# Patient Record
Sex: Female | Born: 1985
Health system: Southern US, Community
[De-identification: ages and names within clinical notes are randomized; demographics above are authoritative.]

## PROBLEM LIST (undated history)

## (undated) DIAGNOSIS — O09299 Supervision of pregnancy with other poor reproductive or obstetric history, unspecified trimester: Secondary | ICD-10-CM

## (undated) DIAGNOSIS — E039 Hypothyroidism, unspecified: Secondary | ICD-10-CM

## (undated) HISTORY — PX: OTHER SURGICAL HISTORY: SHX169

## (undated) HISTORY — PX: WISDOM TOOTH EXTRACTION: SHX21

## (undated) HISTORY — PX: ROTATOR CUFF REPAIR: SHX139

## (undated) HISTORY — DX: Supervision of pregnancy with other poor reproductive or obstetric history, unspecified trimester: O09.299

---

## 2015-08-06 DIAGNOSIS — H5213 Myopia, bilateral: Secondary | ICD-10-CM | POA: Diagnosis not present

## 2015-11-21 DIAGNOSIS — Z6820 Body mass index (BMI) 20.0-20.9, adult: Secondary | ICD-10-CM | POA: Diagnosis not present

## 2015-11-21 DIAGNOSIS — Z124 Encounter for screening for malignant neoplasm of cervix: Secondary | ICD-10-CM | POA: Diagnosis not present

## 2015-11-21 DIAGNOSIS — Z01419 Encounter for gynecological examination (general) (routine) without abnormal findings: Secondary | ICD-10-CM | POA: Diagnosis not present

## 2015-11-27 DIAGNOSIS — Z0000 Encounter for general adult medical examination without abnormal findings: Secondary | ICD-10-CM | POA: Diagnosis not present

## 2015-12-11 DIAGNOSIS — O200 Threatened abortion: Secondary | ICD-10-CM | POA: Diagnosis not present

## 2015-12-26 DIAGNOSIS — O200 Threatened abortion: Secondary | ICD-10-CM | POA: Diagnosis not present

## 2016-01-30 ENCOUNTER — Other Ambulatory Visit: Payer: Self-pay | Admitting: Obstetrics and Gynecology

## 2016-01-30 DIAGNOSIS — N63 Unspecified lump in unspecified breast: Secondary | ICD-10-CM

## 2016-02-05 ENCOUNTER — Ambulatory Visit
Admission: RE | Admit: 2016-02-05 | Discharge: 2016-02-05 | Disposition: A | Discharge status: Home / Self Care | Payer: 59 | Source: Ambulatory Visit | Attending: Obstetrics and Gynecology | Admitting: Obstetrics and Gynecology

## 2016-02-05 DIAGNOSIS — N6489 Other specified disorders of breast: Secondary | ICD-10-CM | POA: Diagnosis not present

## 2016-02-05 DIAGNOSIS — N63 Unspecified lump in unspecified breast: Secondary | ICD-10-CM

## 2016-02-07 DIAGNOSIS — N915 Oligomenorrhea, unspecified: Secondary | ICD-10-CM | POA: Diagnosis not present

## 2016-02-11 MED FILL — CLOMIPHENE CITRATE 50 MG TA: 50 | 5 days supply | Qty: 5 | Fill #0

## 2016-03-25 MED FILL — CLOMIPHENE CITRATE 50 MG TA: 50 | 28 days supply | Qty: 5 | Fill #0

## 2016-05-02 DIAGNOSIS — N970 Female infertility associated with anovulation: Secondary | ICD-10-CM | POA: Diagnosis not present

## 2016-05-17 DIAGNOSIS — N979 Female infertility, unspecified: Secondary | ICD-10-CM | POA: Diagnosis not present

## 2016-05-19 DIAGNOSIS — N970 Female infertility associated with anovulation: Secondary | ICD-10-CM | POA: Diagnosis not present

## 2016-05-19 DIAGNOSIS — N979 Female infertility, unspecified: Secondary | ICD-10-CM | POA: Diagnosis not present

## 2016-05-20 DIAGNOSIS — Z3141 Encounter for fertility testing: Secondary | ICD-10-CM | POA: Diagnosis not present

## 2016-06-02 DIAGNOSIS — N970 Female infertility associated with anovulation: Secondary | ICD-10-CM | POA: Diagnosis not present

## 2016-06-30 DIAGNOSIS — N970 Female infertility associated with anovulation: Secondary | ICD-10-CM | POA: Diagnosis not present

## 2016-07-13 DIAGNOSIS — N979 Female infertility, unspecified: Secondary | ICD-10-CM | POA: Diagnosis not present

## 2016-07-19 DIAGNOSIS — N979 Female infertility, unspecified: Secondary | ICD-10-CM | POA: Diagnosis not present

## 2016-07-21 DIAGNOSIS — N979 Female infertility, unspecified: Secondary | ICD-10-CM | POA: Diagnosis not present

## 2016-08-05 DIAGNOSIS — N979 Female infertility, unspecified: Secondary | ICD-10-CM | POA: Diagnosis not present

## 2016-08-12 DIAGNOSIS — N979 Female infertility, unspecified: Secondary | ICD-10-CM | POA: Diagnosis not present

## 2016-08-14 DIAGNOSIS — N979 Female infertility, unspecified: Secondary | ICD-10-CM | POA: Diagnosis not present

## 2016-08-20 DIAGNOSIS — H5213 Myopia, bilateral: Secondary | ICD-10-CM | POA: Diagnosis not present

## 2016-08-20 DIAGNOSIS — H52203 Unspecified astigmatism, bilateral: Secondary | ICD-10-CM | POA: Diagnosis not present

## 2016-09-17 DIAGNOSIS — N979 Female infertility, unspecified: Secondary | ICD-10-CM | POA: Diagnosis not present

## 2016-09-22 MED FILL — EMOQUETTE 28 DAY TABLET: 0.15-30 | 28 days supply | Qty: 28 | Fill #0

## 2016-10-06 DIAGNOSIS — N978 Female infertility of other origin: Secondary | ICD-10-CM | POA: Diagnosis not present

## 2016-10-06 DIAGNOSIS — N979 Female infertility, unspecified: Secondary | ICD-10-CM | POA: Diagnosis not present

## 2016-10-06 DIAGNOSIS — Z1159 Encounter for screening for other viral diseases: Secondary | ICD-10-CM | POA: Diagnosis not present

## 2016-10-06 DIAGNOSIS — Z1329 Encounter for screening for other suspected endocrine disorder: Secondary | ICD-10-CM | POA: Diagnosis not present

## 2016-10-13 DIAGNOSIS — Z31430 Encounter of female for testing for genetic disease carrier status for procreative management: Secondary | ICD-10-CM | POA: Diagnosis not present

## 2016-10-13 DIAGNOSIS — Z1371 Encounter for nonprocreative screening for genetic disease carrier status: Secondary | ICD-10-CM | POA: Diagnosis not present

## 2016-10-13 MED FILL — SYNTHROID 25 MCG TABLET: 25 | 60 days supply | Qty: 60 | Fill #0

## 2016-10-15 DIAGNOSIS — Z3183 Encounter for assisted reproductive fertility procedure cycle: Secondary | ICD-10-CM | POA: Diagnosis not present

## 2016-10-21 DIAGNOSIS — N979 Female infertility, unspecified: Secondary | ICD-10-CM | POA: Diagnosis not present

## 2016-10-24 DIAGNOSIS — Z3183 Encounter for assisted reproductive fertility procedure cycle: Secondary | ICD-10-CM | POA: Diagnosis not present

## 2016-10-27 DIAGNOSIS — Z3183 Encounter for assisted reproductive fertility procedure cycle: Secondary | ICD-10-CM | POA: Diagnosis not present

## 2016-10-29 DIAGNOSIS — Z3183 Encounter for assisted reproductive fertility procedure cycle: Secondary | ICD-10-CM | POA: Diagnosis not present

## 2016-10-31 DIAGNOSIS — N978 Female infertility of other origin: Secondary | ICD-10-CM | POA: Diagnosis not present

## 2016-11-14 DIAGNOSIS — Z3200 Encounter for pregnancy test, result unknown: Secondary | ICD-10-CM | POA: Diagnosis not present

## 2016-11-25 DIAGNOSIS — N978 Female infertility of other origin: Secondary | ICD-10-CM | POA: Diagnosis not present

## 2016-11-25 DIAGNOSIS — E039 Hypothyroidism, unspecified: Secondary | ICD-10-CM | POA: Diagnosis not present

## 2016-12-01 MED FILL — SYNTHROID 25 MCG TABLET: 25 | 30 days supply | Qty: 30 | Fill #0

## 2016-12-01 MED FILL — CRINONE 8% GEL: 8 | 30 days supply | Qty: 34 | Fill #0

## 2016-12-01 MED FILL — BD NEEDLES 27GX0.5: 27G X 1/2" | 10 days supply | Qty: 20 | Fill #0

## 2016-12-01 MED FILL — GONAL-F RFF REDI-JECT 450 U: 450 | 1 days supply | Qty: 1 | Fill #0

## 2016-12-01 MED FILL — BD SYRINGE 3 ML: 3 ML | 10 days supply | Qty: 20 | Fill #0

## 2016-12-01 MED FILL — EMOQUETTE 28 DAY TABLET: 0.15-30 | 28 days supply | Qty: 28 | Fill #0

## 2016-12-01 MED FILL — MENOPUR 75 UNIT VIAL: 75 | 10 days supply | Qty: 10 | Fill #0

## 2016-12-01 MED FILL — BD NEEDLES 27GX0.5": 27G X 1/2" | 10 days supply | Qty: 20 | Fill #0

## 2016-12-01 MED FILL — GONAL-F RFF REDI-JECT 900 U: 900 | 10 days supply | Qty: 8 | Fill #0

## 2016-12-01 MED FILL — PREGNYL 10,000 UNITS VIAL: 10000 | 1 days supply | Qty: 1 | Fill #0

## 2016-12-31 DIAGNOSIS — Z3183 Encounter for assisted reproductive fertility procedure cycle: Secondary | ICD-10-CM | POA: Diagnosis not present

## 2017-01-05 MED FILL — SYNTHROID 25 MCG TABLET: 25 | 30 days supply | Qty: 30 | Fill #1

## 2017-01-07 DIAGNOSIS — Z3183 Encounter for assisted reproductive fertility procedure cycle: Secondary | ICD-10-CM | POA: Diagnosis not present

## 2017-01-09 DIAGNOSIS — Z3183 Encounter for assisted reproductive fertility procedure cycle: Secondary | ICD-10-CM | POA: Diagnosis not present

## 2017-01-12 DIAGNOSIS — Z3183 Encounter for assisted reproductive fertility procedure cycle: Secondary | ICD-10-CM | POA: Diagnosis not present

## 2017-01-14 DIAGNOSIS — Z3183 Encounter for assisted reproductive fertility procedure cycle: Secondary | ICD-10-CM | POA: Diagnosis not present

## 2017-01-16 DIAGNOSIS — Z3183 Encounter for assisted reproductive fertility procedure cycle: Secondary | ICD-10-CM | POA: Diagnosis not present

## 2017-01-21 DIAGNOSIS — Z3183 Encounter for assisted reproductive fertility procedure cycle: Secondary | ICD-10-CM | POA: Diagnosis not present

## 2017-01-30 DIAGNOSIS — Z3200 Encounter for pregnancy test, result unknown: Secondary | ICD-10-CM | POA: Diagnosis not present

## 2017-02-01 DIAGNOSIS — Z3183 Encounter for assisted reproductive fertility procedure cycle: Secondary | ICD-10-CM | POA: Diagnosis not present

## 2017-02-02 MED FILL — SYNTHROID 25 MCG TABLET: 25 | 30 days supply | Qty: 30 | Fill #2

## 2017-02-10 DIAGNOSIS — Z3183 Encounter for assisted reproductive fertility procedure cycle: Secondary | ICD-10-CM | POA: Diagnosis not present

## 2017-02-18 DIAGNOSIS — Z3183 Encounter for assisted reproductive fertility procedure cycle: Secondary | ICD-10-CM | POA: Diagnosis not present

## 2017-02-24 DIAGNOSIS — Z3183 Encounter for assisted reproductive fertility procedure cycle: Secondary | ICD-10-CM | POA: Diagnosis not present

## 2017-03-02 MED FILL — ESTRADIOL 2 MG TABLET: 2 | 30 days supply | Qty: 90 | Fill #0

## 2017-03-02 MED FILL — SYNTHROID 25 MCG TABLET: 25 | 30 days supply | Qty: 30 | Fill #3

## 2017-03-03 DIAGNOSIS — Z3183 Encounter for assisted reproductive fertility procedure cycle: Secondary | ICD-10-CM | POA: Diagnosis not present

## 2017-03-12 DIAGNOSIS — Z3200 Encounter for pregnancy test, result unknown: Secondary | ICD-10-CM | POA: Diagnosis not present

## 2017-03-13 MED FILL — CRINONE 8% GEL: 8 | 30 days supply | Qty: 34 | Fill #0

## 2017-03-14 DIAGNOSIS — Z3201 Encounter for pregnancy test, result positive: Secondary | ICD-10-CM | POA: Diagnosis not present

## 2017-03-26 MED FILL — ESTRADIOL 2 MG TABLET: 2 | 30 days supply | Qty: 90 | Fill #1

## 2017-03-26 MED FILL — SYNTHROID 25 MCG TABLET: 25 | 30 days supply | Qty: 30 | Fill #4

## 2017-03-30 MED FILL — CRINONE 8% GEL: 8 | 30 days supply | Qty: 68 | Fill #0

## 2017-03-31 DIAGNOSIS — O09819 Supervision of pregnancy resulting from assisted reproductive technology, unspecified trimester: Secondary | ICD-10-CM | POA: Diagnosis not present

## 2017-04-13 DIAGNOSIS — O09819 Supervision of pregnancy resulting from assisted reproductive technology, unspecified trimester: Secondary | ICD-10-CM | POA: Diagnosis not present

## 2017-04-14 DIAGNOSIS — N925 Other specified irregular menstruation: Secondary | ICD-10-CM | POA: Diagnosis not present

## 2017-04-14 DIAGNOSIS — Z01419 Encounter for gynecological examination (general) (routine) without abnormal findings: Secondary | ICD-10-CM | POA: Diagnosis not present

## 2017-04-14 DIAGNOSIS — Z124 Encounter for screening for malignant neoplasm of cervix: Secondary | ICD-10-CM | POA: Diagnosis not present

## 2017-04-14 DIAGNOSIS — Z3480 Encounter for supervision of other normal pregnancy, unspecified trimester: Secondary | ICD-10-CM | POA: Diagnosis not present

## 2017-04-14 LAB — HM PAP SMEAR: HM PAP: NEGATIVE

## 2017-04-16 LAB — OB RESULTS CONSOLE GC/CHLAMYDIA: Chlamydia: NEGATIVE

## 2017-04-16 LAB — HM PAP SMEAR: HM Pap smear: NEGATIVE

## 2017-04-20 ENCOUNTER — Ambulatory Visit (INDEPENDENT_AMBULATORY_CARE_PROVIDER_SITE_OTHER): Payer: 59 | Admitting: Family Medicine

## 2017-04-20 ENCOUNTER — Encounter: Payer: Self-pay | Admitting: Family Medicine

## 2017-04-20 VITALS — BP 118/78 | Ht 66.5 in | Wt 135.8 lb

## 2017-04-20 DIAGNOSIS — Z0000 Encounter for general adult medical examination without abnormal findings: Secondary | ICD-10-CM

## 2017-04-20 DIAGNOSIS — Z3A09 9 weeks gestation of pregnancy: Secondary | ICD-10-CM | POA: Diagnosis not present

## 2017-04-20 LAB — POCT URINALYSIS DIP (PROADVANTAGE DEVICE)
BILIRUBIN UA: NEGATIVE
BILIRUBIN UA: NEGATIVE mg/dL
GLUCOSE UA: NEGATIVE mg/dL
Leukocytes, UA: NEGATIVE
NITRITE UA: NEGATIVE
PH UA: 6.5 (ref 5.0–8.0)
Protein Ur, POC: NEGATIVE mg/dL
RBC UA: NEGATIVE
SPECIFIC GRAVITY, URINE: 1.02
Urobilinogen, Ur: NEGATIVE

## 2017-04-20 NOTE — Patient Instructions (Signed)

## 2017-04-20 NOTE — Progress Notes (Signed)
Subjective:    Patient ID: Robertine Kipper, female    DOB: [birth year], 31 y.o.   MRN: [id number]  HPI Chief Complaint  Patient presents with  . new pt    new pt fasting cpe, wants to check about food allergies   She is new to the practice and here for a complete physical exam. Previous medical care: PCP in [place].  Dr. Philis Pique is her OB/GYN who she has been seeing regularly due to pregnancy.   Dr. Toney Reil - fertility specialist and endocrinologist. She has been attempting pregnancy and has history of 1 early miscarriage last year. States she is now 9 1/[redacted] weeks pregnant.   States she has noticed some possible food intolerances over the past several weeks that started shortly before her pregnancy. Certain foods such as peanut butter cause her to have upset stomach and belching and this is fairly new.   BP at her OB/GYN last week was 120/70.   Social history: Lives with husband, works as PT Diet: healthy  Excerise: 3 days per week.   Immunizations: up to date, [employer] employee   Health maintenance:  Mammogram: N/A Colonoscopy: N/A Last Gynecological Exam: pap smear last week at OB/GYN  Last Menstrual cycle: irregular due to fertility treatments.   Last Dental Exam: twice annually  Last Eye Exam: every March   Depression screen [facility] 2/9 04/20/2017  Decreased Interest 0  Down, Depressed, Hopeless 0  PHQ - 2 Score 0    Wears seatbelt always, uses sunscreen, smoke detectors in home and functioning, does not text while driving and feels safe in home environment.   Reviewed allergies, medications, past medical, surgical, family, and social history.   Review of Systems Review of Systems Constitutional: -fever, -chills, -sweats, -unexpected weight change,-fatigue ENT: -runny nose, -ear pain, -sore throat Cardiology:  -chest pain, -palpitations, -edema Respiratory: -cough, -shortness of breath, -wheezing Gastroenterology: -abdominal pain, -nausea, -vomiting, -diarrhea,  -constipation  Hematology: -bleeding or bruising problems Musculoskeletal: -arthralgias, -myalgias, -joint swelling, -back pain Ophthalmology: -vision changes Urology: -dysuria, -difficulty urinating, -hematuria, -urinary frequency, -urgency Neurology: -headache, -weakness, -tingling, -numbness       Objective:   Physical Exam BP 118/78   Ht 5' 6.5" (1.689 m)   Wt 135 lb 12.8 oz (61.6 kg)   BMI 21.59 kg/m   General Appearance:    Alert, cooperative, no distress, appears stated age  Head:    Normocephalic, without obvious abnormality, atraumatic  Eyes:    PERRL, conjunctiva/corneas clear, EOM's intact, fundi    benign  Ears:    Normal TM's and external ear canals  Nose:   Nares normal, mucosa normal, no drainage or sinus   tenderness  Throat:   Lips, mucosa, and tongue normal; teeth and gums normal  Neck:   Supple, no lymphadenopathy;  thyroid:  no   enlargement/tenderness/nodules; no carotid   bruit or JVD  Back:    Spine nontender, no curvature, ROM normal, no CVA     tenderness  Lungs:     Clear to auscultation bilaterally without wheezes, rales or     ronchi; respirations unlabored  Chest Wall:    No tenderness or deformity   Heart:    Regular rate and rhythm, S1 and S2 normal, no murmur, rub   or gallop  Breast Exam:    Done at OB/GYN  Abdomen:     Soft, non-tender, nondistended, normoactive bowel sounds,    no masses, no hepatosplenomegaly  Genitalia:    Done at OB/GYN  Rectal:  Not performed due to age<40 and no related complaints  Extremities:   No clubbing, cyanosis or edema  Pulses:   2+ and symmetric all extremities  Skin:   Skin color, texture, turgor normal, no rashes or lesions  Lymph nodes:   Cervical, supraclavicular, and axillary nodes normal  Neurologic:   CNII-XII intact, normal strength, sensation and gait; reflexes 2+ and symmetric throughout          Psych:   Normal mood, affect, hygiene and grooming.    Urinalysis dipstick: negative        Assessment & Plan:  Routine general medical examination at a health care facility - Plan: POCT Urinalysis DIP (Proadvantage Device), CBC with Differential/Platelet, Comprehensive metabolic panel, Lipid panel  [redacted] weeks gestation of pregnancy  She is pleasant and healthy appearing.   Up to date with health maintenance and immunizations.  Will check labs and follow up.  She is getting regular prenatal care and I wish her the best with her current pregnancy.  I will see her back as needed.

## 2017-04-21 ENCOUNTER — Encounter: Payer: Self-pay | Admitting: Family Medicine

## 2017-04-21 LAB — LIPID PANEL
CHOL/HDL RATIO: 2.4 (calc) (ref <5.0)
CHOLESTEROL: 190 mg/dL (ref <200)
HDL: 78 mg/dL (ref >50)
LDL CHOLESTEROL (CALC): 92 mg/dL
Non-HDL Cholesterol (Calc): 112 mg/dL (calc) (ref <130)
TRIGLYCERIDES: 103 mg/dL (ref <150)

## 2017-04-21 LAB — COMPREHENSIVE METABOLIC PANEL
AG Ratio: 1.2 (calc) (ref 1.0–2.5)
ALBUMIN MSPROF: 4 g/dL (ref 3.6–5.1)
ALT: 8 U/L (ref 6–29)
AST: 18 U/L (ref 10–30)
Alkaline phosphatase (APISO): 44 U/L (ref 33–115)
BUN: 8 mg/dL (ref 7–25)
CO2: 21 mmol/L (ref 20–32)
Calcium: 9.1 mg/dL (ref 8.6–10.2)
Chloride: 103 mmol/L (ref 98–110)
Creat: 0.66 mg/dL (ref 0.50–1.10)
GLOBULIN: 3.3 g/dL (ref 1.9–3.7)
GLUCOSE: 88 mg/dL (ref 65–99)
POTASSIUM: 4.5 mmol/L (ref 3.5–5.3)
Sodium: 135 mmol/L (ref 135–146)
TOTAL PROTEIN: 7.3 g/dL (ref 6.1–8.1)
Total Bilirubin: 1.3 mg/dL — ABNORMAL HIGH (ref 0.2–1.2)

## 2017-04-21 LAB — CBC WITH DIFFERENTIAL/PLATELET
BASOS PCT: 0.7 %
Basophils Absolute: 50 cells/uL (ref 0–200)
EOS ABS: 22 {cells}/uL (ref 15–500)
Eosinophils Relative: 0.3 %
HCT: 40.1 % (ref 35.0–45.0)
Hemoglobin: 13.6 g/dL (ref 11.7–15.5)
Lymphs Abs: 1202 cells/uL (ref 850–3900)
MCH: 30.5 pg (ref 27.0–33.0)
MCHC: 33.9 g/dL (ref 32.0–36.0)
MCV: 89.9 fL (ref 80.0–100.0)
MONOS PCT: 9.2 %
MPV: 10.6 fL (ref 7.5–12.5)
NEUTROS PCT: 73.1 %
Neutro Abs: 5263 cells/uL (ref 1500–7800)
PLATELETS: 298 10*3/uL (ref 140–400)
RBC: 4.46 10*6/uL (ref 3.80–5.10)
RDW: 11.7 % (ref 11.0–15.0)
TOTAL LYMPHOCYTE: 16.7 %
WBC: 7.2 10*3/uL (ref 3.8–10.8)
WBCMIX: 662 {cells}/uL (ref 200–950)

## 2017-04-24 ENCOUNTER — Encounter: Payer: Self-pay | Admitting: Family Medicine

## 2017-05-06 DIAGNOSIS — Z3682 Encounter for antenatal screening for nuchal translucency: Secondary | ICD-10-CM | POA: Diagnosis not present

## 2017-05-06 DIAGNOSIS — Z3480 Encounter for supervision of other normal pregnancy, unspecified trimester: Secondary | ICD-10-CM | POA: Diagnosis not present

## 2017-05-06 DIAGNOSIS — E039 Hypothyroidism, unspecified: Secondary | ICD-10-CM | POA: Diagnosis not present

## 2017-05-06 MED FILL — SYNTHROID 25 MCG TABLET: 25 | 90 days supply | Qty: 90 | Fill #0

## 2017-06-03 DIAGNOSIS — Z3482 Encounter for supervision of other normal pregnancy, second trimester: Secondary | ICD-10-CM | POA: Diagnosis not present

## 2017-06-23 NOTE — L&D Delivery Note (Signed)
Delivery Note At 3:33 AM a viable and healthy female was delivered via Vaginal, Spontaneous (Presentation: Left Occiput Anterior;  ).  APGAR: 6, 8; weight 3 lb 13 oz (1730 g).   Placenta status: Spontaneous, intact.  Cord: 3V   Anesthesia: Epidural   Episiotomy: None Lacerations: 1st degree Suture Repair: 3.0 vicryl Est. Blood Loss (mL): 150  Mom to postpartum.  Baby to NICU.  Vanessa Kick 10/06/2017, 4:06 AM

## 2017-06-26 DIAGNOSIS — Z363 Encounter for antenatal screening for malformations: Secondary | ICD-10-CM | POA: Diagnosis not present

## 2017-06-26 DIAGNOSIS — E039 Hypothyroidism, unspecified: Secondary | ICD-10-CM | POA: Diagnosis not present

## 2017-07-24 DIAGNOSIS — Z369 Encounter for antenatal screening, unspecified: Secondary | ICD-10-CM | POA: Diagnosis not present

## 2017-08-03 MED FILL — SYNTHROID 25 MCG TABLET: 25 | 90 days supply | Qty: 90 | Fill #1

## 2017-08-20 DIAGNOSIS — Z23 Encounter for immunization: Secondary | ICD-10-CM | POA: Diagnosis not present

## 2017-08-20 DIAGNOSIS — Z3480 Encounter for supervision of other normal pregnancy, unspecified trimester: Secondary | ICD-10-CM | POA: Diagnosis not present

## 2017-08-20 DIAGNOSIS — Z369 Encounter for antenatal screening, unspecified: Secondary | ICD-10-CM | POA: Diagnosis not present

## 2017-08-26 DIAGNOSIS — H52203 Unspecified astigmatism, bilateral: Secondary | ICD-10-CM | POA: Diagnosis not present

## 2017-08-26 DIAGNOSIS — H5213 Myopia, bilateral: Secondary | ICD-10-CM | POA: Diagnosis not present

## 2017-09-03 DIAGNOSIS — O99810 Abnormal glucose complicating pregnancy: Secondary | ICD-10-CM | POA: Diagnosis not present

## 2017-09-09 DIAGNOSIS — E039 Hypothyroidism, unspecified: Secondary | ICD-10-CM | POA: Diagnosis not present

## 2017-09-09 DIAGNOSIS — O4423 Partial placenta previa NOS or without hemorrhage, third trimester: Secondary | ICD-10-CM | POA: Diagnosis not present

## 2017-09-23 DIAGNOSIS — O139 Gestational [pregnancy-induced] hypertension without significant proteinuria, unspecified trimester: Secondary | ICD-10-CM | POA: Diagnosis not present

## 2017-09-28 DIAGNOSIS — O133 Gestational [pregnancy-induced] hypertension without significant proteinuria, third trimester: Secondary | ICD-10-CM | POA: Diagnosis not present

## 2017-09-28 DIAGNOSIS — O26843 Uterine size-date discrepancy, third trimester: Secondary | ICD-10-CM | POA: Diagnosis not present

## 2017-10-01 ENCOUNTER — Inpatient Hospital Stay (HOSPITAL_COMMUNITY)
Admission: AD | Admit: 2017-10-01 | Discharge: 2017-10-01 | Disposition: A | Discharge status: Home / Self Care | Payer: 59 | Source: Ambulatory Visit | Attending: Obstetrics and Gynecology | Admitting: Obstetrics and Gynecology

## 2017-10-01 DIAGNOSIS — Z3A33 33 weeks gestation of pregnancy: Secondary | ICD-10-CM | POA: Insufficient documentation

## 2017-10-01 DIAGNOSIS — O139 Gestational [pregnancy-induced] hypertension without significant proteinuria, unspecified trimester: Secondary | ICD-10-CM | POA: Diagnosis not present

## 2017-10-01 DIAGNOSIS — O133 Gestational [pregnancy-induced] hypertension without significant proteinuria, third trimester: Secondary | ICD-10-CM | POA: Diagnosis not present

## 2017-10-01 DIAGNOSIS — E039 Hypothyroidism, unspecified: Secondary | ICD-10-CM | POA: Diagnosis not present

## 2017-10-01 MED ORDER — BETAMETHASONE SOD PHOS & ACET 6 (3-3) MG/ML IJ SUSP
12.0000 mg | Freq: Once | INTRAMUSCULAR | Status: AC
  Administered 2017-10-01: 12 mg via INTRAMUSCULAR
  Filled 2017-10-01: qty 2

## 2017-10-01 NOTE — MAU Note (Signed)
Pt here for Betamethasone inject #1

## 2017-10-02 ENCOUNTER — Encounter (HOSPITAL_COMMUNITY): Payer: Self-pay | Admitting: *Deleted

## 2017-10-02 ENCOUNTER — Inpatient Hospital Stay (EMERGENCY_DEPARTMENT_HOSPITAL)
Admission: AD | Admit: 2017-10-02 | Discharge: 2017-10-02 | Disposition: A | Discharge status: Home / Self Care | Payer: 59 | Source: Ambulatory Visit | Attending: Obstetrics and Gynecology | Admitting: Obstetrics and Gynecology

## 2017-10-02 DIAGNOSIS — O1414 Severe pre-eclampsia complicating childbirth: Secondary | ICD-10-CM | POA: Diagnosis not present

## 2017-10-02 DIAGNOSIS — D649 Anemia, unspecified: Secondary | ICD-10-CM | POA: Diagnosis not present

## 2017-10-02 DIAGNOSIS — O479 False labor, unspecified: Secondary | ICD-10-CM | POA: Diagnosis not present

## 2017-10-02 DIAGNOSIS — O4700 False labor before 37 completed weeks of gestation, unspecified trimester: Secondary | ICD-10-CM

## 2017-10-02 DIAGNOSIS — R030 Elevated blood-pressure reading, without diagnosis of hypertension: Secondary | ICD-10-CM | POA: Diagnosis not present

## 2017-10-02 DIAGNOSIS — O1493 Unspecified pre-eclampsia, third trimester: Secondary | ICD-10-CM | POA: Diagnosis not present

## 2017-10-02 DIAGNOSIS — O9902 Anemia complicating childbirth: Secondary | ICD-10-CM | POA: Diagnosis not present

## 2017-10-02 DIAGNOSIS — Z3A33 33 weeks gestation of pregnancy: Secondary | ICD-10-CM | POA: Diagnosis not present

## 2017-10-02 HISTORY — DX: Hypothyroidism, unspecified: E03.9

## 2017-10-02 LAB — COMPREHENSIVE METABOLIC PANEL
ALT: 24 U/L (ref 14–54)
ANION GAP: 9 (ref 5–15)
AST: 35 U/L (ref 15–41)
Albumin: 3.2 g/dL — ABNORMAL LOW (ref 3.5–5.0)
Alkaline Phosphatase: 121 U/L (ref 38–126)
BUN: 13 mg/dL (ref 6–20)
CHLORIDE: 107 mmol/L (ref 101–111)
CO2: 20 mmol/L — ABNORMAL LOW (ref 22–32)
Calcium: 9 mg/dL (ref 8.9–10.3)
Creatinine, Ser: 0.7 mg/dL (ref 0.44–1.00)
Glucose, Bld: 107 mg/dL — ABNORMAL HIGH (ref 65–99)
Potassium: 4 mmol/L (ref 3.5–5.1)
Sodium: 136 mmol/L (ref 135–145)
Total Bilirubin: 0.9 mg/dL (ref 0.3–1.2)
Total Protein: 7.5 g/dL (ref 6.5–8.1)

## 2017-10-02 LAB — CBC
HCT: 35.1 % — ABNORMAL LOW (ref 36.0–46.0)
Hemoglobin: 12.6 g/dL (ref 12.0–15.0)
MCH: 31.8 pg (ref 26.0–34.0)
MCHC: 35.9 g/dL (ref 30.0–36.0)
MCV: 88.6 fL (ref 78.0–100.0)
PLATELETS: 276 10*3/uL (ref 150–400)
RBC: 3.96 MIL/uL (ref 3.87–5.11)
RDW: 12.4 % (ref 11.5–15.5)
WBC: 19.7 10*3/uL — ABNORMAL HIGH (ref 4.0–10.5)

## 2017-10-02 LAB — PROTEIN / CREATININE RATIO, URINE
CREATININE, URINE: 109 mg/dL
Protein Creatinine Ratio: 0.15 mg/mg{Cre} (ref 0.00–0.15)
TOTAL PROTEIN, URINE: 16 mg/dL

## 2017-10-02 MED ORDER — LABETALOL HCL 200 MG PO TABS: 200.0000 mg | ORAL_TABLET | Freq: Two times a day (BID) | ORAL | 0 refills | Status: DC

## 2017-10-02 MED ORDER — LACTATED RINGERS IV BOLUS
1000.0000 mL | Freq: Once | INTRAVENOUS | Status: AC
  Administered 2017-10-02: 1000 mL via INTRAVENOUS

## 2017-10-02 MED ORDER — LABETALOL HCL 100 MG PO TABS
200.0000 mg | ORAL_TABLET | Freq: Once | ORAL | Status: AC
  Administered 2017-10-02: 200 mg via ORAL
  Filled 2017-10-02: qty 2

## 2017-10-02 MED ORDER — BETAMETHASONE SOD PHOS & ACET 6 (3-3) MG/ML IJ SUSP
12.0000 mg | Freq: Once | INTRAMUSCULAR | Status: AC
  Administered 2017-10-02: 12 mg via INTRAMUSCULAR
  Filled 2017-10-02: qty 2

## 2017-10-02 MED ORDER — NIFEDIPINE 10 MG PO CAPS
10.0000 mg | ORAL_CAPSULE | ORAL | Status: AC | PRN
  Administered 2017-10-02 (×3): 10 mg via ORAL
  Filled 2017-10-02 (×3): qty 1

## 2017-10-02 NOTE — Progress Notes (Signed)
MD informed of pt's BP, orders received for labs, serial BP's, & FHR monitoring.

## 2017-10-02 NOTE — MAU Provider Note (Addendum)
Patient Kimberly Gomez is a 32 y.o. G1P0 At [redacted]w[redacted]d here for 2nd shot of BMZ and for evaluation of elevated BP.  She denies blurry vision, RUQ pain, NV, HA or sudden swelling.  History     CSN: [id number]  Arrival date and time: 10/02/17 1643   None     Chief Complaint  Patient presents with  . Injections   HPI Patient was diagnosed with gestational hypertension by Dr. Malachi Carl office on Thursday, April 4 with bp of 140/90s at her regular prenatal visit.  She had a follow up on Monday and she had BPs in 150/90s. HEr blood work was fine; urine was fine.   She had an appt yesterday for prenatal and NST twice a week. Yesterday she had trace protein and had blood work (patient doesn't know the results). Her BPs yesterday were 160/90 and 152/90.  She received BMZ yesterday and then was told to come to MAU for her 2nd shot today (Friday, April 12).   When she had her vitals taken today in MAU triage, it was elevated. Dr. Ouida Sills recommended that she stay for monitoring and blood work and PCR.    She has been feeling abdominal pain off and on for the past few days; feels like small period cramps. She rates them 3/10, she is not actively worried about them.   She has been having loose stools for two weeks; she denies sick contacts. No one around her has been sick.  OB History    Gravida  1   Para      Term      Preterm      AB      Living        SAB      TAB      Ectopic      Multiple      Live Births              Past Medical History:  Diagnosis Date  . Hypothyroidism     Past Surgical History:  Procedure Laterality Date  . ROTATOR CUFF REPAIR Left   . WISDOM TOOTH EXTRACTION      Family History  Problem Relation Age of Onset  . Hyperlipidemia Father   . Hyperlipidemia Maternal Grandmother   . Heart failure Paternal Grandmother     Social History   Tobacco Use  . Smoking status: Never Smoker  . Smokeless tobacco: Never Used  Substance Use  Topics  . Alcohol use: No  . Drug use: No    Allergies:  Allergies  Allergen Reactions  . Amoxicillin   . Penicillins     No medications prior to admission.    Review of Systems  Constitutional: Negative.   HENT: Negative.   Respiratory: Negative.   Cardiovascular: Negative.   Gastrointestinal: Negative.   Genitourinary: Negative.   Musculoskeletal: Negative.   Neurological: Negative.   Hematological: Negative.   Psychiatric/Behavioral: Negative.    Physical Exam   Blood pressure (!) 141/95, pulse 88, temperature 98.6 F (37 C), temperature source Oral, resp. rate 16.  Physical Exam  Constitutional: She is oriented to person, place, and time.  HENT:  Head: Normocephalic.  Neck: Normal range of motion.  GI: Soft.  Neurological: She is alert and oriented to person, place, and time.  Skin: Skin is warm and dry.  Psychiatric: She has a normal mood and affect.    MAU Course  Procedures  MDM -Patient's labs are stable; no signs of pre-e.  -  Patient continues to be asympmtomatic.  -NST: 140 bpm, mod var, present acel, neg decels, contractions every 4-5 min. Contractions are 5 min apart, she rates them very mild.   -Cervix checked at 1915 and at 2045; still long closed and thick.   -10 mg of procardia given x 3 in MAU (see med rec for times).   Patient's contractions seem to be stronger and more frequent to her now at 2100 Per Dr. Ouida Sills, will try 1 Liter of LR before moving to terbutaline. Pressures slightly lower now; still asymptomatic.    Patient care handed off to [place] at 2146.  Assessment and Plan   After 1L of LR, pt still having ctx q 3-5 minutes. She is feeling about 1/2 of them as "tightening" and the other 1/2 as cramps.  Cx at 2230 LTC, unchanged for 3 hours.  Pt does not want terbutaline.  Dr. Ouida Sills called and given report.  ORdered Labetalol 200mg  BID and for pt to f/u on Monday in office.  Pt in agreement w/plan. Husband can monitor BP at home.   Pt aware to f/U ASAP if bp>160/110.   [name] 10/02/2017, 11:44 PM

## 2017-10-02 NOTE — MAU Note (Signed)
Pt here for 2nd BMZ, denies uc's, bleeding or LOF.

## 2017-10-02 NOTE — Discharge Instructions (Signed)
Braxton Hicks Contractions °Contractions of the uterus can occur throughout pregnancy, but they are not always a sign that you are in labor. You may have practice contractions called Braxton Hicks contractions. These false labor contractions are sometimes confused with true labor. °What are Braxton Hicks contractions? °Braxton Hicks contractions are tightening movements that occur in the muscles of the uterus before labor. Unlike true labor contractions, these contractions do not result in opening (dilation) and thinning of the cervix. Toward the end of pregnancy (32-34 weeks), Braxton Hicks contractions can happen more often and may become stronger. These contractions are sometimes difficult to tell apart from true labor because they can be very uncomfortable. You should not feel embarrassed if you go to the hospital with false labor. °Sometimes, the only way to tell if you are in true labor is for your health care provider to look for changes in the cervix. The health care provider will do a physical exam and may monitor your contractions. If you are not in true labor, the exam should show that your cervix is not dilating and your water has not broken. °If there are other health problems associated with your pregnancy, it is completely safe for you to be sent home with false labor. You may continue to have Braxton Hicks contractions until you go into true labor. °How to tell the difference between true labor and false labor °True labor °· Contractions last 30-70 seconds. °· Contractions become very regular. °· Discomfort is usually felt in the top of the uterus, and it spreads to the lower abdomen and low back. °· Contractions do not go away with walking. °· Contractions usually become more intense and increase in frequency. °· The cervix dilates and gets thinner. °False labor °· Contractions are usually shorter and not as strong as true labor contractions. °· Contractions are usually irregular. °· Contractions  are often felt in the front of the lower abdomen and in the groin. °· Contractions may go away when you walk around or change positions while lying down. °· Contractions get weaker and are shorter-lasting as time goes on. °· The cervix usually does not dilate or become thin. °Follow these instructions at home: °· Take over-the-counter and prescription medicines only as told by your health care provider. °· Keep up with your usual exercises and follow other instructions from your health care provider. °· Eat and drink lightly if you think you are going into labor. °· If Braxton Hicks contractions are making you uncomfortable: °? Change your position from lying down or resting to walking, or change from walking to resting. °? Sit and rest in a tub of warm water. °? Drink enough fluid to keep your urine pale yellow. Dehydration may cause these contractions. °? Do slow and deep breathing several times an hour. °· Keep all follow-up prenatal visits as told by your health care provider. This is important. °Contact a health care provider if: °· You have a fever. °· You have continuous pain in your abdomen. °Get help right away if: °· Your contractions become stronger, more regular, and closer together. °· You have fluid leaking or gushing from your vagina. °· You pass blood-tinged mucus (bloody show). °· You have bleeding from your vagina. °· You have low back pain that you never had before. °· You feel your baby’s head pushing down and causing pelvic pressure. °· Your baby is not moving inside you as much as it used to. °Summary °· Contractions that occur before labor are called Braxton   Hicks contractions, false labor, or practice contractions. °· Braxton Hicks contractions are usually shorter, weaker, farther apart, and less regular than true labor contractions. True labor contractions usually become progressively stronger and regular and they become more frequent. °· Manage discomfort from Braxton Hicks contractions by  changing position, resting in a warm bath, drinking plenty of water, or practicing deep breathing. °This information is not intended to replace advice given to you by your health care provider. Make sure you discuss any questions you have with your health care provider. °Document Released: 10/23/2016 Document Revised: 10/23/2016 Document Reviewed: 10/23/2016 °Elsevier Interactive Patient Education © 2018 [employer] ° °

## 2017-10-05 ENCOUNTER — Encounter (HOSPITAL_COMMUNITY): Payer: Self-pay

## 2017-10-05 ENCOUNTER — Other Ambulatory Visit: Payer: Self-pay

## 2017-10-05 ENCOUNTER — Inpatient Hospital Stay (HOSPITAL_COMMUNITY): Payer: 59

## 2017-10-05 ENCOUNTER — Inpatient Hospital Stay (HOSPITAL_COMMUNITY)
Admission: AD | Admit: 2017-10-05 | Discharge: 2017-10-10 | DRG: 807 | Disposition: A | Discharge status: Home / Self Care | Payer: 59 | Source: Ambulatory Visit | Attending: Obstetrics and Gynecology | Admitting: Obstetrics and Gynecology

## 2017-10-05 DIAGNOSIS — O1494 Unspecified pre-eclampsia, complicating childbirth: Secondary | ICD-10-CM | POA: Diagnosis not present

## 2017-10-05 DIAGNOSIS — O164 Unspecified maternal hypertension, complicating childbirth: Secondary | ICD-10-CM | POA: Diagnosis not present

## 2017-10-05 DIAGNOSIS — O163 Unspecified maternal hypertension, third trimester: Secondary | ICD-10-CM | POA: Diagnosis not present

## 2017-10-05 DIAGNOSIS — Z3A00 Weeks of gestation of pregnancy not specified: Secondary | ICD-10-CM | POA: Diagnosis not present

## 2017-10-05 DIAGNOSIS — O1414 Severe pre-eclampsia complicating childbirth: Secondary | ICD-10-CM | POA: Diagnosis present

## 2017-10-05 DIAGNOSIS — O1493 Unspecified pre-eclampsia, third trimester: Secondary | ICD-10-CM | POA: Diagnosis not present

## 2017-10-05 DIAGNOSIS — O9902 Anemia complicating childbirth: Secondary | ICD-10-CM | POA: Diagnosis present

## 2017-10-05 DIAGNOSIS — Z3A33 33 weeks gestation of pregnancy: Secondary | ICD-10-CM | POA: Diagnosis not present

## 2017-10-05 DIAGNOSIS — R030 Elevated blood-pressure reading, without diagnosis of hypertension: Secondary | ICD-10-CM | POA: Diagnosis present

## 2017-10-05 DIAGNOSIS — O133 Gestational [pregnancy-induced] hypertension without significant proteinuria, third trimester: Secondary | ICD-10-CM

## 2017-10-05 DIAGNOSIS — O43819 Placental infarction, unspecified trimester: Secondary | ICD-10-CM | POA: Diagnosis not present

## 2017-10-05 DIAGNOSIS — D649 Anemia, unspecified: Secondary | ICD-10-CM | POA: Diagnosis present

## 2017-10-05 DIAGNOSIS — Z3689 Encounter for other specified antenatal screening: Secondary | ICD-10-CM

## 2017-10-05 DIAGNOSIS — I10 Essential (primary) hypertension: Secondary | ICD-10-CM

## 2017-10-05 DIAGNOSIS — O1403 Mild to moderate pre-eclampsia, third trimester: Secondary | ICD-10-CM | POA: Diagnosis not present

## 2017-10-05 HISTORY — DX: Gestational (pregnancy-induced) hypertension without significant proteinuria, third trimester: O13.3

## 2017-10-05 LAB — URINALYSIS, ROUTINE W REFLEX MICROSCOPIC
Bilirubin Urine: NEGATIVE
GLUCOSE, UA: NEGATIVE mg/dL
Hgb urine dipstick: NEGATIVE
KETONES UR: NEGATIVE mg/dL
Leukocytes, UA: NEGATIVE
Nitrite: NEGATIVE
Protein, ur: 100 mg/dL — AB
RBC / HPF: NONE SEEN RBC/hpf (ref 0–5)
Specific Gravity, Urine: 1.005 (ref 1.005–1.030)
pH: 8 (ref 5.0–8.0)

## 2017-10-05 LAB — CBC
HEMATOCRIT: 33.9 % — AB (ref 36.0–46.0)
Hemoglobin: 12.2 g/dL (ref 12.0–15.0)
MCH: 32 pg (ref 26.0–34.0)
MCHC: 36 g/dL (ref 30.0–36.0)
MCV: 89 fL (ref 78.0–100.0)
PLATELETS: 241 10*3/uL (ref 150–400)
RBC: 3.81 MIL/uL — AB (ref 3.87–5.11)
RDW: 12.6 % (ref 11.5–15.5)
WBC: 15.2 10*3/uL — ABNORMAL HIGH (ref 4.0–10.5)

## 2017-10-05 LAB — COMPREHENSIVE METABOLIC PANEL
ALBUMIN: 3 g/dL — AB (ref 3.5–5.0)
ALT: 27 U/L (ref 14–54)
AST: 38 U/L (ref 15–41)
Alkaline Phosphatase: 114 U/L (ref 38–126)
Anion gap: 9 (ref 5–15)
BILIRUBIN TOTAL: 1 mg/dL (ref 0.3–1.2)
BUN: 13 mg/dL (ref 6–20)
CO2: 19 mmol/L — ABNORMAL LOW (ref 22–32)
CREATININE: 0.73 mg/dL (ref 0.44–1.00)
Calcium: 8.5 mg/dL — ABNORMAL LOW (ref 8.9–10.3)
Chloride: 107 mmol/L (ref 101–111)
GFR calc Af Amer: >60 mL/min (ref >60)
GLUCOSE: 88 mg/dL (ref 65–99)
Potassium: 4 mmol/L (ref 3.5–5.1)
Sodium: 135 mmol/L (ref 135–145)
Total Protein: 6.7 g/dL (ref 6.5–8.1)

## 2017-10-05 LAB — GROUP B STREP BY PCR: Group B strep by PCR: NEGATIVE

## 2017-10-05 LAB — PROTEIN / CREATININE RATIO, URINE
Creatinine, Urine: 48 mg/dL
Protein Creatinine Ratio: 2.29 mg/mg{Cre} — ABNORMAL HIGH (ref 0.00–0.15)
Total Protein, Urine: 110 mg/dL

## 2017-10-05 LAB — OB RESULTS CONSOLE GBS: GBS: NEGATIVE

## 2017-10-05 MED ORDER — MAGNESIUM SULFATE BOLUS VIA INFUSION
6.0000 g | Freq: Once | INTRAVENOUS | Status: AC
  Administered 2017-10-05: 6 g via INTRAVENOUS
  Filled 2017-10-05: qty 500

## 2017-10-05 MED ORDER — LACTATED RINGERS IV SOLN
INTRAVENOUS | Status: DC
  Administered 2017-10-05: 08:00:00 via INTRAVENOUS

## 2017-10-05 MED ORDER — LABETALOL HCL 200 MG PO TABS
200.0000 mg | ORAL_TABLET | Freq: Two times a day (BID) | ORAL | Status: DC
  Administered 2017-10-05 – 2017-10-08 (×7): 200 mg via ORAL
  Filled 2017-10-05 (×7): qty 1

## 2017-10-05 MED ORDER — HYDRALAZINE HCL 20 MG/ML IJ SOLN
10.0000 mg | Freq: Once | INTRAMUSCULAR | Status: AC | PRN
  Administered 2017-10-05: 10 mg via INTRAVENOUS
  Filled 2017-10-05: qty 1

## 2017-10-05 MED ORDER — HYDRALAZINE HCL 20 MG/ML IJ SOLN
10.0000 mg | Freq: Once | INTRAMUSCULAR | Status: AC | PRN
  Administered 2017-10-06: 10 mg via INTRAVENOUS
  Filled 2017-10-05 (×2): qty 1

## 2017-10-05 MED ORDER — TERBUTALINE SULFATE 1 MG/ML IJ SOLN: 0.2500 mg | Freq: Once | INTRAMUSCULAR | Status: DC | PRN

## 2017-10-05 MED ORDER — OXYTOCIN BOLUS FROM INFUSION
500.0000 mL | Freq: Once | INTRAVENOUS | Status: AC
  Administered 2017-10-06: 500 mL via INTRAVENOUS

## 2017-10-05 MED ORDER — PRENATAL MULTIVITAMIN CH
1.0000 | ORAL_TABLET | Freq: Every day | ORAL | Status: DC
  Filled 2017-10-05: qty 1

## 2017-10-05 MED ORDER — CALCIUM CARBONATE ANTACID 500 MG PO CHEW: 2.0000 | CHEWABLE_TABLET | ORAL | Status: DC | PRN

## 2017-10-05 MED ORDER — BUTORPHANOL TARTRATE 1 MG/ML IJ SOLN
1.0000 mg | INTRAMUSCULAR | Status: DC | PRN
  Administered 2017-10-05 – 2017-10-06 (×2): 1 mg via INTRAVENOUS
  Filled 2017-10-05 (×2): qty 1

## 2017-10-05 MED ORDER — ACETAMINOPHEN 325 MG PO TABS: 650.0000 mg | ORAL_TABLET | ORAL | Status: DC | PRN

## 2017-10-05 MED ORDER — ONDANSETRON HCL 4 MG/2ML IJ SOLN: 4.0000 mg | Freq: Four times a day (QID) | INTRAMUSCULAR | Status: DC | PRN

## 2017-10-05 MED ORDER — LEVOTHYROXINE SODIUM 25 MCG PO TABS
25.0000 ug | ORAL_TABLET | Freq: Every day | ORAL | Status: DC
  Administered 2017-10-05 – 2017-10-10 (×6): 25 ug via ORAL
  Filled 2017-10-05 (×8): qty 1

## 2017-10-05 MED ORDER — LABETALOL HCL 5 MG/ML IV SOLN
20.0000 mg | INTRAVENOUS | Status: AC | PRN
  Administered 2017-10-06: 40 mg via INTRAVENOUS
  Administered 2017-10-06: 20 mg via INTRAVENOUS
  Administered 2017-10-06: 80 mg via INTRAVENOUS
  Filled 2017-10-05: qty 4
  Filled 2017-10-05: qty 16
  Filled 2017-10-05: qty 8

## 2017-10-05 MED ORDER — OXYTOCIN 40 UNITS IN LACTATED RINGERS INFUSION - SIMPLE MED
2.5000 [IU]/h | INTRAVENOUS | Status: DC
  Filled 2017-10-05: qty 1000

## 2017-10-05 MED ORDER — OXYCODONE-ACETAMINOPHEN 5-325 MG PO TABS: 1.0000 | ORAL_TABLET | ORAL | Status: DC | PRN

## 2017-10-05 MED ORDER — SOD CITRATE-CITRIC ACID 500-334 MG/5ML PO SOLN: 30.0000 mL | ORAL | Status: DC | PRN

## 2017-10-05 MED ORDER — MAGNESIUM SULFATE 40 G IN LACTATED RINGERS - SIMPLE
2.0000 g/h | INTRAVENOUS | Status: AC
  Administered 2017-10-05 – 2017-10-06 (×2): 2 g/h via INTRAVENOUS
  Filled 2017-10-05 (×2): qty 40

## 2017-10-05 MED ORDER — LORAZEPAM 2 MG/ML IJ SOLN
1.0000 mg | Freq: Four times a day (QID) | INTRAMUSCULAR | Status: DC | PRN
  Filled 2017-10-05: qty 0.5

## 2017-10-05 MED ORDER — LACTATED RINGERS IV SOLN
INTRAVENOUS | Status: DC
  Administered 2017-10-05 (×3): via INTRAVENOUS

## 2017-10-05 MED ORDER — LIDOCAINE HCL (PF) 1 % IJ SOLN
30.0000 mL | INTRAMUSCULAR | Status: DC | PRN
  Filled 2017-10-05: qty 30

## 2017-10-05 MED ORDER — LABETALOL HCL 5 MG/ML IV SOLN
20.0000 mg | INTRAVENOUS | Status: AC | PRN
  Administered 2017-10-05: 20 mg via INTRAVENOUS
  Administered 2017-10-05: 40 mg via INTRAVENOUS
  Administered 2017-10-05: 80 mg via INTRAVENOUS
  Filled 2017-10-05: qty 4
  Filled 2017-10-05: qty 16
  Filled 2017-10-05: qty 8

## 2017-10-05 MED ORDER — LACTATED RINGERS IV SOLN: INTRAVENOUS | Status: DC

## 2017-10-05 MED ORDER — MISOPROSTOL 25 MCG QUARTER TABLET
25.0000 ug | ORAL_TABLET | ORAL | Status: DC | PRN
  Administered 2017-10-05 – 2017-10-06 (×4): 25 ug via VAGINAL
  Filled 2017-10-05 (×5): qty 1

## 2017-10-05 MED ORDER — OXYCODONE-ACETAMINOPHEN 5-325 MG PO TABS: 2.0000 | ORAL_TABLET | ORAL | Status: DC | PRN

## 2017-10-05 MED ORDER — LACTATED RINGERS IV SOLN: 500.0000 mL | INTRAVENOUS | Status: DC | PRN

## 2017-10-05 MED ORDER — DOCUSATE SODIUM 100 MG PO CAPS
100.0000 mg | ORAL_CAPSULE | Freq: Every day | ORAL | Status: DC
  Filled 2017-10-05: qty 1

## 2017-10-05 MED ORDER — ZOLPIDEM TARTRATE 5 MG PO TABS: 5.0000 mg | ORAL_TABLET | Freq: Every evening | ORAL | Status: DC | PRN

## 2017-10-05 MED ORDER — VANCOMYCIN HCL IN DEXTROSE 1-5 GM/200ML-% IV SOLN
1000.0000 mg | Freq: Two times a day (BID) | INTRAVENOUS | Status: DC
  Administered 2017-10-05: 1000 mg via INTRAVENOUS
  Filled 2017-10-05 (×2): qty 200

## 2017-10-05 NOTE — MAU Provider Note (Addendum)
History     CSN: [id number]  Arrival date and time: 10/05/17 5176   First Provider Initiated Contact with Patient 10/05/17 [phone]    Chief Complaint  Patient presents with  . Hypertension  . Back Pain  . Chest Pain   HPI Candid Krizek is a 32 y.o. G1P0 at [redacted]w[redacted]d who presents with chest tightness and elevated blood pressures. She states she woke up and felt this pressure in her chest and "shaking all over." She took her blood pressure at home and it was 180s/110s so she came to the hospital. She denies any headache, visual changes, or epigastric pain. She reports good fetal movement. Denies any contractions, vaginal bleeding or discharge.   She was diagnosed with gestational hypertension on 09/24/17 and given steroids on 4/11 and 4/12. She had normal preeclampsia labs on 4/12.   OB History    Gravida  1   Para      Term      Preterm      AB      Living        SAB      TAB      Ectopic      Multiple      Live Births              Past Medical History:  Diagnosis Date  . Hypothyroidism     Past Surgical History:  Procedure Laterality Date  . ROTATOR CUFF REPAIR Left   . WISDOM TOOTH EXTRACTION      Family History  Problem Relation Age of Onset  . Hyperlipidemia Father   . Hyperlipidemia Maternal Grandmother   . Heart failure Paternal Grandmother     Social History   Tobacco Use  . Smoking status: Never Smoker  . Smokeless tobacco: Never Used  Substance Use Topics  . Alcohol use: No  . Drug use: No    Allergies:  Allergies  Allergen Reactions  . Amoxicillin   . Penicillins     Medications Prior to Admission  Medication Sig Dispense Refill Last Dose  . labetalol (NORMODYNE) 200 MG tablet Take 1 tablet (200 mg total) by mouth 2 (two) times daily. 60 tablet 0 10/04/2017 at Unknown time  . levothyroxine (SYNTHROID, LEVOTHROID) 25 MCG tablet Take 25 mcg by mouth daily before breakfast.   10/04/2017 at Unknown time  . Prenatal Vit-Fe  Fumarate-FA (PRENATAL MULTIVITAMIN) TABS tablet Take 1 tablet by mouth daily at 12 noon.   Past Week at Unknown time    Review of Systems  Constitutional: Negative.  Negative for fatigue and fever.  HENT: Negative.   Eyes: Negative for visual disturbance.  Respiratory: Positive for chest tightness. Negative for shortness of breath.   Cardiovascular: Negative.  Negative for chest pain.  Gastrointestinal: Negative.  Negative for abdominal pain, constipation, diarrhea, nausea and vomiting.  Genitourinary: Negative.  Negative for dysuria.  Neurological: Negative.  Negative for dizziness and headaches.   Physical Exam   Blood pressure (!) 189/102, pulse 73, temperature 97.7 F (36.5 C), temperature source Oral, resp. rate 20, height 5\' 6"  (1.676 m), weight 160 lb 1.9 oz (72.6 kg), SpO2 99 %.  Patient Vitals for the past 24 hrs:  BP Temp Temp src Pulse Resp SpO2 Height Weight  10/05/17 0830 (!) 164/101 - - 73 16 98 % - -  10/05/17 0811 (!) 174/107 - - 73 - 99 % - -  10/05/17 0801 (!) 171/100 - - 71 - - - -  10/05/17 0756 - - - - -  100 % - -  10/05/17 0753 (!) 186/108 - - 72 17 - - -  10/05/17 0746 (!) 188/106 - - 72 17 100 % - -  10/05/17 0731 (!) 189/102 - - 73 - - - -  10/05/17 0715 (!) 180/97 97.7 F (36.5 C) Oral 73 20 99 % 5\' 6"  (1.676 m) 160 lb 1.9 oz (72.6 kg)    Physical Exam  Nursing note and vitals reviewed. Constitutional: She is oriented to person, place, and time. She appears well-developed and well-nourished. No distress.  HENT:  Head: Normocephalic.  Eyes: Pupils are equal, round, and reactive to light.  Cardiovascular: Normal rate, regular rhythm and normal heart sounds.  Respiratory: Effort normal and breath sounds normal. No respiratory distress.  GI: Soft. Bowel sounds are normal. She exhibits no distension. There is no tenderness.  Neurological: She is alert and oriented to person, place, and time.  Reflex Scores:      Patellar reflexes are 3+ on the right side  and 3+ on the left side. Skin: Skin is warm and dry.  Psychiatric: She has a normal mood and affect. Her behavior is normal. Judgment and thought content normal.   Fetal Tracing:  Baseline: 145 Variability: moderate Accels: 10x10 Decels: none  Toco: none  MAU Course  Procedures Results for orders placed or performed during the hospital encounter of 10/05/17 (from the past 24 hour(s))  Protein / creatinine ratio, urine     Status: Abnormal   Collection Time: 10/05/17  7:20 AM  Result Value Ref Range   Creatinine, Urine 48.00 mg/dL   Total Protein, Urine 110 mg/dL   Protein Creatinine Ratio 2.29 (H) 0.00 - 0.15 mg/mg[Cre]  Urinalysis, Routine w reflex microscopic     Status: Abnormal   Collection Time: 10/05/17  7:20 AM  Result Value Ref Range   Color, Urine STRAW (A) YELLOW   APPearance CLEAR CLEAR   Specific Gravity, Urine 1.005 1.005 - 1.030   pH 8.0 5.0 - 8.0   Glucose, UA NEGATIVE NEGATIVE mg/dL   Hgb urine dipstick NEGATIVE NEGATIVE   Bilirubin Urine NEGATIVE NEGATIVE   Ketones, ur NEGATIVE NEGATIVE mg/dL   Protein, ur 100 (A) NEGATIVE mg/dL   Nitrite NEGATIVE NEGATIVE   Leukocytes, UA NEGATIVE NEGATIVE   RBC / HPF NONE SEEN 0 - 5 RBC/hpf   WBC, UA 0-5 0 - 5 WBC/hpf   Bacteria, UA FEW (A) NONE SEEN   Squamous Epithelial / LPF 0-5 (A) NONE SEEN   Hyaline Casts, UA PRESENT   CBC     Status: Abnormal   Collection Time: 10/05/17  7:43 AM  Result Value Ref Range   WBC 15.2 (H) 4.0 - 10.5 K/uL   RBC 3.81 (L) 3.87 - 5.11 MIL/uL   Hemoglobin 12.2 12.0 - 15.0 g/dL   HCT 33.9 (L) 36.0 - 46.0 %   MCV 89.0 78.0 - 100.0 fL   MCH 32.0 26.0 - 34.0 pg   MCHC 36.0 30.0 - 36.0 g/dL   RDW 12.6 11.5 - 15.5 %   Platelets 241 150 - 400 K/uL  Comprehensive metabolic panel     Status: Abnormal   Collection Time: 10/05/17  7:43 AM  Result Value Ref Range   Sodium 135 135 - 145 mmol/L   Potassium 4.0 3.5 - 5.1 mmol/L   Chloride 107 101 - 111 mmol/L   CO2 19 (L) 22 - 32 mmol/L    Glucose, Bld 88 65 - 99 mg/dL   BUN 13 6 -  20 mg/dL   Creatinine, Ser 0.73 0.44 - 1.00 mg/dL   Calcium 8.5 (L) 8.9 - 10.3 mg/dL   Total Protein 6.7 6.5 - 8.1 g/dL   Albumin 3.0 (L) 3.5 - 5.0 g/dL   AST 38 15 - 41 U/L   ALT 27 14 - 54 U/L   Alkaline Phosphatase 114 38 - 126 U/L   Total Bilirubin 1.0 0.3 - 1.2 mg/dL   GFR calc non Af Amer >60 >60 mL/min   GFR calc Af Amer >60 >60 mL/min   Anion gap 9 5 - 15     MDM UA CBC, CMP, protein/creat ratio Labetalol protocol ordered with second severe range BP ED EKG- normal sinus rhythm   Dr. Harrington Challenger notified of patient's arrival, complaints and that preeclampsia protocol has been initiated- desires to continue to monitor patient and be called back with results of lab work.   Care turned over Robyne Askew NP at [phone]. Wende Mott, CNM 10/05/17 8:13 AM  BPs have come down some but still in severe range after completing IV antihypertensive proctocol with 3 doses of labetalol (20,40,80) & 1 dose of hydralazine  Dr. Harrington Challenger updated with lab results and BP after intervention. Will come see patient with plans to admit for severe preeclampsia.  Assessment and Plan  A: 1. Pre-eclampsia in third trimester   2. [redacted] weeks gestation of pregnancy    P: Dr. Harrington Challenger en route to see patient

## 2017-10-05 NOTE — MAU Note (Addendum)
Pt C/O "shakes", chest pressure, back pain since around 0400.  BP was 180/110 @ home.  Denies HA, visual changes or epigastric pain.  Reports good fetal movement.  "I feel like I can't take a deep breath."

## 2017-10-05 NOTE — H&P (Signed)
Kimberly Gomez is a 32 y.o. female presenting for elevated blood pressures and chest pain  32 yo G2P0010 @ 33+4 presents for elevated BPs. She was initially noted to have elevated BPs at 31+6 with BPs 140/90s. PIH labs WNL. [place] for growth showed EFW 25%. Pt was seen in office last week with BPs 160-180/100-110. She received BMZ as an outpatient over the weekend. She was placed on po labetalol 200mg  BID Friday. She presented to MAU today d/t elevated BPS and mild chest discomfort. She denies HA/vision changes/N/V. The Pts case was discussed with MFM, Dr Leonides Sake, given that the patient is already s/p steroids and the patient's BPs have worsened despite po antihypertensives, she recommended proceed with IOL now. D/W pt and family and she wishes to proceed. OB History    Gravida  1   Para      Term      Preterm      AB      Living        SAB      TAB      Ectopic      Multiple      Live Births             Past Medical History:  Diagnosis Date  . Hypothyroidism    Past Surgical History:  Procedure Laterality Date  . ROTATOR CUFF REPAIR Left   . WISDOM TOOTH EXTRACTION     Family History: family history includes Heart failure in her paternal grandmother; Hyperlipidemia in her father and maternal grandmother. Social History:  reports that she has never smoked. She has never used smokeless tobacco. She reports that she does not drink alcohol or use drugs.     Maternal Diabetes: No Genetic Screening: Normal Maternal Ultrasounds/Referrals: Normal Fetal Ultrasounds or other Referrals:  None Maternal Substance Abuse:  No Significant Maternal Medications:  Meds include: Syntroid Significant Maternal Lab Results:  None Other Comments:  None  ROS History   Blood pressure 138/90, pulse 96, temperature 97.7 F (36.5 C), temperature source Oral, resp. rate 17, height 5\' 6"  (1.676 m), weight 72.6 kg (160 lb 1.9 oz), SpO2 98 %. Exam Physical Exam  Prenatal labs: ABO, Rh:  A  pos Antibody:  Neg Rubella:   Imm RPR:  NR  HBsAg:   Neg HIV:   NR GBS:   Unk  Assessment/Plan: 1) Admit' 2) Magnesium for neuroprotection 3) Misoprostal 80mcg PV Q 4 hrs followed by AROM/pit 4) Antihypertensive protocol 5) ePIDURAL ON request   Vanessa Kick 10/05/2017, 10:31 AM

## 2017-10-05 NOTE — Consult Note (Signed)
Neonatology Consult to Antenatal Patient:  I was asked by Dr. Harrington Challenger to see this patient in order to provide antenatal counseling due to prematuriity.  Mrs. Kimberly Gomez admitted today at 55 4/[redacted] weeks GA. She is currently being induced for severe blood pressures despite antihypertensives.  Pregancy complicated by hypothyroidism, GHTN with now newly diagnosed pre-eclampsia.  During recent MAU visit for Kimberly Gomez, she reported PTL.  Went home with continued BP monitoring until this morning's presentation back to MAU for other complaints.  She is now receiving IV Mag, hydralazine, labetalol, synthroid and Vancomycin and is being induced.    I spoke with the patient patient and husband. We discussed the worst case of delivery in the next 1-2 days, including usual DR management, possible respiratory complications and need for support, IV access, feedings (mother desires breast feeding, which was encouraged), LOS, Mortality and Morbidity, and long term outcomes. Questions were answered andI offered a NICU tour to any interested family members and would be glad to come back if she has more questions later.  Thank you for asking me to see this patient.  Kimberly Sabal Katherina Mires, MD Neonatologist  The total length of face-to-face or floor/unit time for this encounter was 45 minutes. Counseling and/or coordination of care was 20 minutes of the above.

## 2017-10-05 NOTE — Progress Notes (Signed)
NICU called for NEO consult

## 2017-10-05 NOTE — Anesthesia Pain Management Evaluation Note (Signed)
  CRNA Pain Management Visit Note  Patient: Kimberly Gomez, 32 y.o., female  "Hello I am a member of the anesthesia team at [facility]. We have an anesthesia team available at all times to provide care throughout the hospital, including epidural management and anesthesia for C-section. I don't know your plan for the delivery whether it a natural birth, water birth, IV sedation, nitrous supplementation, doula or epidural, but we want to meet your pain goals."   1.Was your pain managed to your expectations on prior hospitalizations?   No prior hospitalizations  2.What is your expectation for pain management during this hospitalization?     Epidural  3.How can we help you reach that goal? Epidural @ pain goal.  Record the patient's initial score and the patient's pain goal.   Pain: 2 (headache)  Pain Goal: 8 [facility] wants you to be able to say your pain was always managed very well.  Kimberly Gomez 10/05/2017

## 2017-10-06 ENCOUNTER — Ambulatory Visit (HOSPITAL_COMMUNITY): Payer: 59

## 2017-10-06 ENCOUNTER — Encounter (HOSPITAL_COMMUNITY): Payer: Self-pay | Admitting: *Deleted

## 2017-10-06 ENCOUNTER — Inpatient Hospital Stay (HOSPITAL_COMMUNITY): Payer: 59 | Admitting: Anesthesiology

## 2017-10-06 DIAGNOSIS — O133 Gestational [pregnancy-induced] hypertension without significant proteinuria, third trimester: Secondary | ICD-10-CM

## 2017-10-06 HISTORY — DX: Gestational (pregnancy-induced) hypertension without significant proteinuria, third trimester: O13.3

## 2017-10-06 LAB — CBC
HCT: 33.5 % — ABNORMAL LOW (ref 36.0–46.0)
HCT: 31.5 % — ABNORMAL LOW (ref 36.0–46.0)
HEMOGLOBIN: 12 g/dL (ref 12.0–15.0)
HEMOGLOBIN: 11.3 g/dL — AB (ref 12.0–15.0)
MCH: 32.1 pg (ref 26.0–34.0)
MCH: 32.2 pg (ref 26.0–34.0)
MCHC: 35.8 g/dL (ref 30.0–36.0)
MCHC: 35.9 g/dL (ref 30.0–36.0)
MCV: 89.6 fL (ref 78.0–100.0)
MCV: 89.7 fL (ref 78.0–100.0)
Platelets: 235 10*3/uL (ref 150–400)
Platelets: 212 10*3/uL (ref 150–400)
RBC: 3.74 MIL/uL — AB (ref 3.87–5.11)
RBC: 3.51 MIL/uL — AB (ref 3.87–5.11)
RDW: 12.8 % (ref 11.5–15.5)
RDW: 12.8 % (ref 11.5–15.5)
WBC: 14 10*3/uL — ABNORMAL HIGH (ref 4.0–10.5)
WBC: 14.9 10*3/uL — ABNORMAL HIGH (ref 4.0–10.5)

## 2017-10-06 LAB — SYPHILIS: RPR W/REFLEX TO RPR TITER AND TREPONEMAL ANTIBODIES, TRADITIONAL SCREENING AND DIAGNOSIS ALGORITHM: RPR Ser Ql: NONREACTIVE

## 2017-10-06 MED ORDER — PHENYLEPHRINE 40 MCG/ML (10ML) SYRINGE FOR IV PUSH (FOR BLOOD PRESSURE SUPPORT)
80.0000 ug | PREFILLED_SYRINGE | INTRAVENOUS | Status: DC | PRN
  Filled 2017-10-06: qty 5

## 2017-10-06 MED ORDER — LIDOCAINE HCL (PF) 1 % IJ SOLN
INTRAMUSCULAR | Status: DC | PRN
  Administered 2017-10-06: 4 mL via EPIDURAL
  Administered 2017-10-06: 8 mL via EPIDURAL

## 2017-10-06 MED ORDER — ACETAMINOPHEN 325 MG PO TABS
650.0000 mg | ORAL_TABLET | ORAL | Status: DC | PRN
Start: 2017-10-06 — End: 2017-10-10
  Administered 2017-10-06: 650 mg via ORAL
  Filled 2017-10-06: qty 2

## 2017-10-06 MED ORDER — LACTATED RINGERS IV SOLN
INTRAVENOUS | Status: DC
  Administered 2017-10-06 (×2): via INTRAVENOUS

## 2017-10-06 MED ORDER — EPHEDRINE 5 MG/ML INJ
10.0000 mg | INTRAVENOUS | Status: DC | PRN
  Filled 2017-10-06: qty 2

## 2017-10-06 MED ORDER — TETANUS-DIPHTH-ACELL PERTUSSIS 5-2.5-18.5 LF-MCG/0.5 IM SUSP: 0.5000 mL | Freq: Once | INTRAMUSCULAR | Status: DC

## 2017-10-06 MED ORDER — OXYCODONE-ACETAMINOPHEN 5-325 MG PO TABS: 2.0000 | ORAL_TABLET | ORAL | Status: DC | PRN

## 2017-10-06 MED ORDER — HYDROCORTISONE ACE-PRAMOXINE 1-1 % RE FOAM
1.0000 | Freq: Two times a day (BID) | RECTAL | Status: DC
  Administered 2017-10-06: 1 via RECTAL
  Filled 2017-10-06 (×3): qty 10

## 2017-10-06 MED ORDER — WITCH HAZEL-GLYCERIN EX PADS: 1.0000 "application " | MEDICATED_PAD | CUTANEOUS | Status: DC | PRN

## 2017-10-06 MED ORDER — DIBUCAINE 1 % RE OINT: 1.0000 "application " | TOPICAL_OINTMENT | RECTAL | Status: DC | PRN

## 2017-10-06 MED ORDER — PHENYLEPHRINE 40 MCG/ML (10ML) SYRINGE FOR IV PUSH (FOR BLOOD PRESSURE SUPPORT)
80.0000 ug | PREFILLED_SYRINGE | INTRAVENOUS | Status: DC | PRN
  Filled 2017-10-06: qty 5
  Filled 2017-10-06: qty 10

## 2017-10-06 MED ORDER — DIPHENHYDRAMINE HCL 50 MG/ML IJ SOLN: 12.5000 mg | INTRAMUSCULAR | Status: DC | PRN

## 2017-10-06 MED ORDER — PRENATAL MULTIVITAMIN CH
1.0000 | ORAL_TABLET | Freq: Every day | ORAL | Status: DC
  Administered 2017-10-06 – 2017-10-10 (×5): 1 via ORAL
  Filled 2017-10-06 (×5): qty 1

## 2017-10-06 MED ORDER — SENNOSIDES-DOCUSATE SODIUM 8.6-50 MG PO TABS
2.0000 | ORAL_TABLET | ORAL | Status: DC
  Administered 2017-10-07 – 2017-10-10 (×4): 2 via ORAL
  Filled 2017-10-06 (×4): qty 2

## 2017-10-06 MED ORDER — HYDRALAZINE HCL 20 MG/ML IJ SOLN
5.0000 mg | INTRAMUSCULAR | Status: DC | PRN
  Filled 2017-10-06: qty 1

## 2017-10-06 MED ORDER — ONDANSETRON HCL 4 MG/2ML IJ SOLN: 4.0000 mg | INTRAMUSCULAR | Status: DC | PRN

## 2017-10-06 MED ORDER — ONDANSETRON HCL 4 MG PO TABS
4.0000 mg | ORAL_TABLET | ORAL | Status: DC | PRN
Start: 2017-10-06 — End: 2017-10-10

## 2017-10-06 MED ORDER — FENTANYL 2.5 MCG/ML BUPIVACAINE 1/10 % EPIDURAL INFUSION (WH - ANES)
14.0000 mL/h | INTRAMUSCULAR | Status: DC | PRN
  Administered 2017-10-06: 14 mL/h via EPIDURAL
  Filled 2017-10-06: qty 100

## 2017-10-06 MED ORDER — LACTATED RINGERS IV SOLN
500.0000 mL | Freq: Once | INTRAVENOUS | Status: AC
  Administered 2017-10-06: 500 mL via INTRAVENOUS

## 2017-10-06 MED ORDER — ZOLPIDEM TARTRATE 5 MG PO TABS: 5.0000 mg | ORAL_TABLET | Freq: Every evening | ORAL | Status: DC | PRN

## 2017-10-06 MED ORDER — DIPHENHYDRAMINE HCL 25 MG PO CAPS: 25.0000 mg | ORAL_CAPSULE | Freq: Four times a day (QID) | ORAL | Status: DC | PRN

## 2017-10-06 MED ORDER — SIMETHICONE 80 MG PO CHEW: 80.0000 mg | CHEWABLE_TABLET | ORAL | Status: DC | PRN

## 2017-10-06 MED ORDER — COCONUT OIL OIL: 1.0000 "application " | TOPICAL_OIL | Status: DC | PRN

## 2017-10-06 MED ORDER — BENZOCAINE-MENTHOL 20-0.5 % EX AERO: 1.0000 "application " | INHALATION_SPRAY | CUTANEOUS | Status: DC | PRN

## 2017-10-06 MED ORDER — OXYCODONE-ACETAMINOPHEN 5-325 MG PO TABS
1.0000 | ORAL_TABLET | ORAL | Status: DC | PRN
  Administered 2017-10-07: 1 via ORAL
  Filled 2017-10-06: qty 1

## 2017-10-06 MED ORDER — IBUPROFEN 600 MG PO TABS
600.0000 mg | ORAL_TABLET | Freq: Four times a day (QID) | ORAL | Status: DC
  Administered 2017-10-06 – 2017-10-10 (×16): 600 mg via ORAL
  Filled 2017-10-06 (×16): qty 1

## 2017-10-06 NOTE — Progress Notes (Signed)
Patient is eating, ambulating, voiding.  Pain control is good.  Vitals:   10/06/17 0542 10/06/17 0545 10/06/17 0600 10/06/17 0630  BP: (!) 169/104 (!) 170/101 (!) 154/87 (!) 144/87  Pulse: 80 72 73 75  Resp: 16 20 17 15   Temp: 98.1 F (36.7 C)     TempSrc: Oral     SpO2:  96% 96%   Weight:      Height:        Fundus firm Perineum without swelling.  Lab Results  Component Value Date   WBC 14.9 (H) 10/06/2017   HGB 11.3 (L) 10/06/2017   HCT 31.5 (L) 10/06/2017   MCV 89.7 10/06/2017   PLT 212 10/06/2017     A+/RI  A/P Post partum day 0 severe preeclampsia and on magnesium sulfate and labetalol.  Routine care.  Expect d/c routine.    Kimberly Gomez A

## 2017-10-06 NOTE — Anesthesia Preprocedure Evaluation (Signed)
Anesthesia Evaluation  Patient identified by MRN, date of birth, ID band Patient awake    Reviewed: Allergy & Precautions, NPO status , Patient's Chart, lab work & pertinent test results, reviewed documented beta blocker date and time   Airway Mallampati: II  TM Distance: >3 FB Neck ROM: Full    Dental   Pulmonary neg pulmonary ROS,    Pulmonary exam normal breath sounds clear to auscultation       Cardiovascular hypertension, Pt. on medications and Pt. on home beta blockers Normal cardiovascular exam Rhythm:Regular Rate:Normal     Neuro/Psych negative neurological ROS  negative psych ROS   GI/Hepatic negative GI ROS, Neg liver ROS,   Endo/Other  Hypothyroidism   Renal/GU negative Renal ROS  negative genitourinary   Musculoskeletal negative musculoskeletal ROS (+)   Abdominal   Peds  Hematology  (+) anemia ,   Anesthesia Other Findings   Reproductive/Obstetrics (+) Pregnancy Pre-eclampsia                             Anesthesia Physical Anesthesia Plan  ASA: III  Anesthesia Plan: Epidural   Post-op Pain Management:    Induction:   PONV Risk Score and Plan:   Airway Management Planned: Natural Airway  Additional Equipment:   Intra-op Plan:   Post-operative Plan:   Informed Consent: I have reviewed the patients History and Physical, chart, labs and discussed the procedure including the risks, benefits and alternatives for the proposed anesthesia with the patient or authorized representative who has indicated his/her understanding and acceptance.     Plan Discussed with:   Anesthesia Plan Comments: (Labs reviewed. Platelets acceptable, patient not taking any blood thinning medications. Risks and benefits discussed with patient, patient expressed understanding and wished to proceed.)        Anesthesia Quick Evaluation

## 2017-10-06 NOTE — Anesthesia Procedure Notes (Signed)
Epidural Patient location during procedure: OB Start time: 10/06/2017 2:43 AM End time: 10/06/2017 2:47 AM  Staffing Anesthesiologist: Audry Pili, MD Performed: anesthesiologist   Preanesthetic Checklist Completed: patient identified, pre-op evaluation, timeout performed, IV checked, risks and benefits discussed and monitors and equipment checked  Epidural Patient position: sitting Prep: DuraPrep Patient monitoring: continuous pulse ox and blood pressure Approach: midline Location: L2-L3 Injection technique: LOR saline  Needle:  Needle type: Tuohy  Needle gauge: 17 G Needle length: 9 cm Needle insertion depth: 4.5 cm Catheter size: 19 Gauge Catheter at skin depth: 10 cm Test dose: negative and Other (1% lidocaine)  Additional Notes Patient identified. Risks including, but not limited to, bleeding, infection, nerve damage, paralysis, inadequate analgesia, blood pressure changes, nausea, vomiting, allergic reaction, postpartum back pain, itching, and headache were discussed. Patient expressed understanding and wished to proceed. Sterile prep and drape, including hand hygiene, mask, and sterile gloves were used. The patient was positioned and the spine was prepped. The skin was anesthetized with lidocaine. No paraesthesia or other complication noted. The patient did not experience any signs of intravascular injection such as tinnitus or metallic taste in mouth, nor signs of intrathecal spread such as rapid motor block. Please see nursing notes for vital signs. The patient tolerated the procedure well.   Kimberly Gomez, MDReason for block:procedure for pain

## 2017-10-06 NOTE — Anesthesia Postprocedure Evaluation (Signed)
Anesthesia Post Note  Patient: Kimberly Gomez  Procedure(s) Performed: AN AD HOC LABOR EPIDURAL     Patient location during evaluation: Mother Baby Anesthesia Type: Epidural Level of consciousness: awake and alert and oriented Pain management: satisfactory to patient Vital Signs Assessment: post-procedure vital signs reviewed and stable Respiratory status: respiratory function stable Cardiovascular status: stable Postop Assessment: no headache, no backache, epidural receding, patient able to bend at knees, no signs of nausea or vomiting and adequate PO intake Anesthetic complications: no    Last Vitals:  Vitals:   10/06/17 1230 10/06/17 1330  BP:    Pulse:    Resp: 18 18  Temp:    SpO2:      Last Pain:  Vitals:   10/06/17 1330  TempSrc:   PainSc: 0-No pain   Pain Goal: Patients Stated Pain Goal: 3 (10/06/17 1030)               Janard Culp

## 2017-10-06 NOTE — Lactation Note (Signed)
This note was copied from a baby's chart. Lactation Consultation Note  Mother del a 33.5 week little Kimberly. Infant is in the NICU, Mother was sat up with a DEBP by staff member. She is pumping every 2-3 hours for 15 mins. Mother was advised to pump last thing before bed and first thing in morning so she could get some uninterrupted sleep;  Mother was given Breastfeed your NICU baby with instructions in caring for her pump parts and milk storage guidelines. Mother was also given Lactation brochure with number to lactation office for follow up phones calls with questions or concerns. Mother was given website to view [employer] hand expression technique.   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 Advised her to determine which pump she wants and to let [place] know.   Mother has pumped several times and has take colostrum drops to the NICU. Mother was taught to massage breast and then to hand express colostrum before pumping.   Mother eager to begin skin to skin, advised frequent skin to skin as often as allowed.   Patient Name: Kimberly Gomez LOVFI'E Date: 10/06/2017 Reason for consult: Initial assessment;Mother's request;1st time breastfeeding;NICU baby;Preterm <34wks;Infant < 6lbs;Maternal endocrine disorder Type of Endocrine Disorder?: Thyroid   Maternal Data Formula Feeding for Exclusion: No Has patient been taught Hand Expression?: Yes Does the patient have breastfeeding experience prior to this delivery?: No  Feeding    LATCH Score                   Interventions Interventions: Breast massage;Hand express;Expressed milk  Lactation Tools Discussed/Used Pump Review: Setup, frequency, and cleaning;Milk Storage Initiated by:: staff nurse Date initiated:: 10/06/17   Consult Status Consult Status: Follow-up Date: 10/07/17 Follow-up type: In-patient    Kimberly Gomez [facility] 10/06/2017, 2:17 PM

## 2017-10-07 MED ORDER — LABETALOL HCL 5 MG/ML IV SOLN: 20.0000 mg | INTRAVENOUS | Status: DC | PRN

## 2017-10-07 MED ORDER — HYDRALAZINE HCL 20 MG/ML IJ SOLN
5.0000 mg | INTRAMUSCULAR | Status: AC | PRN
  Administered 2017-10-07: 5 mg via INTRAVENOUS
  Administered 2017-10-08: 10 mg via INTRAVENOUS
  Filled 2017-10-07: qty 1

## 2017-10-07 MED ORDER — NIFEDIPINE ER OSMOTIC RELEASE 30 MG PO TB24
30.0000 mg | ORAL_TABLET | ORAL | Status: AC
  Administered 2017-10-07: 30 mg via ORAL
  Filled 2017-10-07: qty 1

## 2017-10-07 MED ORDER — NIFEDIPINE ER OSMOTIC RELEASE 30 MG PO TB24
30.0000 mg | ORAL_TABLET | Freq: Every day | ORAL | Status: DC
  Administered 2017-10-08: 30 mg via ORAL
  Filled 2017-10-07: qty 1

## 2017-10-07 NOTE — Progress Notes (Signed)
Dr. Ouida Sills made aware of pt's BP of 186/103. Order given to give 30mg  procardia XL now. Per Dr. Ouida Sills, do not follow the hypertensive protocol.

## 2017-10-07 NOTE — Progress Notes (Signed)
Patient has been complaining of a headache since this morning. Patient received tylenol during the day. Headache never went away. Blood pressure is under 140/ 100's. RN called Dr. Ouida Sills and he ordered Percocet. Will reassess patients condition in a hour.

## 2017-10-07 NOTE — Progress Notes (Signed)
PPD#2 Pt off of MgSO4 now.B/P this am elevated, Not response to Propcardia XL 30mg . HTN protocol begun. I/Os +400cc No headache. Some chest tightness. No RUQ pain Will recheck after apresoline

## 2017-10-08 ENCOUNTER — Encounter (HOSPITAL_COMMUNITY): Payer: Self-pay

## 2017-10-08 MED ORDER — LABETALOL HCL 200 MG PO TABS: 200.0000 mg | ORAL_TABLET | Freq: Every day | ORAL | Status: DC

## 2017-10-08 MED ORDER — NIFEDIPINE ER OSMOTIC RELEASE 30 MG PO TB24
30.0000 mg | ORAL_TABLET | Freq: Every day | ORAL | Status: DC
  Administered 2017-10-09: 30 mg via ORAL
  Filled 2017-10-08: qty 1

## 2017-10-08 MED ORDER — LABETALOL HCL 200 MG PO TABS
300.0000 mg | ORAL_TABLET | Freq: Every day | ORAL | Status: DC
  Administered 2017-10-08: 300 mg via ORAL
  Filled 2017-10-08: qty 1

## 2017-10-08 NOTE — Progress Notes (Signed)
BP down to 119/75 after hydralazine administration. Down from 161/110. Pt asymptomatic from drop in pressure. Will continue to monitor. Toya Smothers, RN

## 2017-10-08 NOTE — Progress Notes (Signed)
Patient is eating, ambulating, voiding.  Pain control is good.  Vitals:   10/07/17 2056 10/07/17 2150 10/07/17 2358 10/08/17 0404  BP: (!) 138/94 135/90 132/88 (!) 149/92  Pulse: 74 77 67 66  Resp:   16 16  Temp:   98.6 F (37 C) 98.3 F (36.8 C)  TempSrc:   Oral Oral  SpO2:   100% 99%  Weight:      Height:        Fundus firm Perineum without swelling.  Lab Results  Component Value Date   WBC 14.9 (H) 10/06/2017   HGB 11.3 (L) 10/06/2017   HCT 31.5 (L) 10/06/2017   MCV 89.7 10/06/2017   PLT 212 10/06/2017     A+/RI  A/P Post partum day 3 with severe preeclampsia and labile blood pressures.  Pt has been stable over night after yesterday having to get further apresoline.  Pt now on labetalol 200 mg bid and nifedipine 30 mg XL.Marland Kitchen Otherwise routine care.  Expect d/c tomorrow- will want to make sure pt no longer has any severe range BPs.    Shaylyn Bawa A

## 2017-10-08 NOTE — Progress Notes (Signed)
Dr. Philis Pique made aware of BP 161/110. Instructed to administer hydralazine. Will continue to monitor. Toya Smothers, RN

## 2017-10-08 NOTE — Progress Notes (Signed)
Dr. Philis Pique aware of BP of 152/104. Updated orders given. Will continue to monitor. Toya Smothers, RN

## 2017-10-09 MED ORDER — LABETALOL HCL 200 MG PO TABS
300.0000 mg | ORAL_TABLET | Freq: Two times a day (BID) | ORAL | Status: DC
  Administered 2017-10-09 – 2017-10-10 (×3): 300 mg via ORAL
  Filled 2017-10-09 (×3): qty 1

## 2017-10-09 MED ORDER — NIFEDIPINE ER OSMOTIC RELEASE 30 MG PO TB24
60.0000 mg | ORAL_TABLET | Freq: Every day | ORAL | Status: DC
  Administered 2017-10-10: 60 mg via ORAL
  Filled 2017-10-09: qty 2

## 2017-10-09 MED ORDER — NIFEDIPINE ER OSMOTIC RELEASE 30 MG PO TB24
30.0000 mg | ORAL_TABLET | Freq: Once | ORAL | Status: AC
  Administered 2017-10-09: 30 mg via ORAL
  Filled 2017-10-09: qty 1

## 2017-10-09 NOTE — Lactation Note (Signed)
This note was copied from a baby's chart. Lactation Consultation Note  Patient Name: Kimberly Gomez POEUM'P Date: 10/09/2017   [facility] Visit Prior to Discharge:  Mother is anticipating discharge today provided her BP's remain stable.  Infant is 33+5 weeks and in the NICU.  Mother is pumping q 2 hours and taking all EBM to NICU.  She pumped 5 mls at her last pumping session.  Reviewed milk storage times with family.  Encouraged mother to continue breast massage, hand expression and pumping q 2-3 hours with one longer break of 4-5 hours during the night if she wishes.  Engorgement prevention/treatment reviewed with mother and encouraged using our OP services when infant gets discharged.  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  All paperwork completed and left in the folder. Family in good spirits and LC encouraged mother to pump at baby's bedside in NICU and to use the pumping rooms when she visits.  Reminded her to bring all pump parts when she visits.  Mother unaware of this possibility to pump at bedside and very excited about this.  FOB present and very supportive.  Allowed time for verbalization of questions.  No further questions/concerns noted at this time.     Kimberly Gomez 10/09/2017, 10:48 AM

## 2017-10-09 NOTE — Progress Notes (Signed)
Patient seen and examined.  Mild intermittent headache, otherwise reports that she is feeling well. Reports headaches throughout pregnancy--unchanged. Bleeding and pain minimal.  Ambulating without difficulty.  Required IV hydralazine yesterday for additional severe range BPs.   Currently on procardial Xl 30mg  daily and labetalol 300mg  PO BID with current BP still not at goal.   BP (!) 157/105 (BP Location: Left Arm)   Pulse 66   Temp 98.5 F (36.9 C) (Oral)   Resp 16   Ht 5\' 6"  (1.676 m)   Wt 72.6 kg (160 lb 1.9 oz)   SpO2 97%   Breastfeeding? Unknown   BMI 25.84 kg/m   NAD Abdomen soft, fundus firm Ext: trace pedal edema, DTRs 3+ b/l  A&P:  G1P0101 PPD#3 s/p SVD at 33wk for severe preeclampsia. Will give additional 30mg  procadia XL now and increase to procardia XL 60mg  daily.  Will continue labetalol 300mg  BID.  If BPs still not controlled this evening, will increase PO labetalol

## 2017-10-10 MED ORDER — LABETALOL HCL 300 MG PO TABS: 300.0000 mg | ORAL_TABLET | Freq: Two times a day (BID) | ORAL | 1 refills | Status: DC

## 2017-10-10 MED ORDER — IBUPROFEN 600 MG PO TABS: 600.0000 mg | ORAL_TABLET | Freq: Four times a day (QID) | ORAL | 0 refills | Status: DC

## 2017-10-10 MED ORDER — NIFEDIPINE ER 60 MG PO TB24: 60.0000 mg | ORAL_TABLET | Freq: Every day | ORAL | 1 refills | Status: DC

## 2017-10-10 NOTE — Lactation Note (Signed)
This note was copied from a baby's chart. Lactation Consultation Note; Mom has just pumped and obtained about 1 1/2 oz transitional milk. Very pleased to see more milk. Has been pumping q 2-3 hours. Has Medela pump for home. Encouraged to have NICU nurse call us when baby ready to latch. Reviewed pumping rooms  In NICU and can bring pump pieces to pump when visiting baby No questions at present. To call prn  Patient Name: Kimberly Gomez OIBBC'W Date: 10/10/2017 Reason for consult: Follow-up assessment;Preterm <34wks;NICU baby   Maternal Data Formula Feeding for Exclusion: No Has patient been taught Hand Expression?: Yes Does the patient have breastfeeding experience prior to this delivery?: No  Feeding Feeding Type: Donor Breast Milk  LATCH Score                   Interventions    Lactation Tools Discussed/Used WIC Program: No   Consult Status Consult Status: Complete    Truddie Crumble 10/10/2017, 8:36 AM

## 2017-10-10 NOTE — Progress Notes (Signed)
All discharge teaching completed with the patient. All printed discharge instructions given and explained to the patient. Patient verbalizes an understanding of all instructions given. Denies any questions or concerns at this time.

## 2017-10-10 NOTE — Discharge Instructions (Signed)
Take your blood pressure at home 2-3x daily.  Please call if blood pressure > 150/100  Please call if you have a severe headache unrelieved by tylenol or motrin, spots in the vision, severe upper abdominal pain or any other concerns.

## 2017-10-10 NOTE — Progress Notes (Signed)
Patient seen and examined.  Anti-hypertensives were increased yesterday and she reports  feeling much better today with the lower blood pressures.   BPs yesterday were improved to 110-130s/90s.  This AM, BP 137/104.  Denies HA/BV/RUQ pain.    BP (!) 132/97   Pulse 77   Temp 99 F (37.2 C) (Oral)   Resp 16   Ht 5\' 6"  (1.676 m)   Wt 160 lb 1.9 oz (72.6 kg)   SpO2 99%   Breastfeeding? Unknown   BMI 25.84 kg/m   NAD Abdomen soft, fundus firm Ext: trace pedal edema, DTRs 3+ b/l  A&P:  G1P0101 PPD#4 s/p SVD at 33wk for severe preeclampsia. Currently on Procardia XL 60mg  daily and labetalol 300 mg BID.  Elevated diastolic this AM, but overall much improved control.  Will repeat BP x 2 this AM.  If improved, will discharge to home with close follow up. Patient able to check BP at home.  Will plan BP check in office in 4-5 days, or sooner if elevated at home.

## 2017-10-10 NOTE — Discharge Summary (Signed)
Obstetric Discharge Summary Reason for Admission: induction of labor for severe preeclampsia Prenatal Procedures: ultrasound Intrapartum Procedures: spontaneous vaginal delivery  Postpartum Procedures: postpartum magnesium sulfate Complications-Operative and Postpartum: 1 degree perineal laceration   Following delivery, patient received 24 hours of PP magnesium.  Her blood pressures were difficult to control and she required multiple doses of IV antihypertensives while titration of PO medication was achieved.  Ultimately, on PPD #4, her blood pressures were stabilized on procardia XL 60mg  daily and labetaolol 300mg  BID.       Hemoglobin  Date Value Ref Range Status  10/06/2017 11.3 (L) 12.0 - 15.0 g/dL Final   HCT  Date Value Ref Range Status  10/06/2017 31.5 (L) 36.0 - 46.0 % Final    Physical Exam:  General: alert, cooperative and appears stated age 32: appropriate Uterine Fundus: firm  DVT Evaluation: No evidence of DVT seen on physical exam.  Discharge Diagnoses: preeclampsia, severe by blood pressure  Discharge Information: Date: 10/10/2017 Activity: pelvic rest Diet: routine Medications: PNV, Ibuprofen and tylenol, labetalol 300mg  BID and nifedipine XL 60mg  daily Condition: stable Instructions: refer to practice specific booklet Discharge to: home   Newborn Data: Live born female  Birth Weight: 3 lb 13 oz (1730 g) APGAR: 6, 8  Newborn Delivery   Birth date/time:  10/06/2017 03:33:00 Delivery type:  Vaginal, Spontaneous     Baby in NICU at 33 weeks  [place] 10/10/2017, 9:05 AM

## 2017-10-10 NOTE — Plan of Care (Signed)
Patient is pleasant and cooperative. Denies any pain or discomfort at this time. Patient is ambulating to the NICU. Tolerating ambulation well.

## 2017-10-12 ENCOUNTER — Encounter (HOSPITAL_COMMUNITY): Payer: Self-pay

## 2017-10-15 ENCOUNTER — Ambulatory Visit: Payer: Self-pay

## 2017-10-15 DIAGNOSIS — E039 Hypothyroidism, unspecified: Secondary | ICD-10-CM | POA: Insufficient documentation

## 2017-10-15 DIAGNOSIS — I10 Essential (primary) hypertension: Secondary | ICD-10-CM | POA: Insufficient documentation

## 2017-10-15 HISTORY — DX: Essential (primary) hypertension: I10

## 2017-10-15 NOTE — Lactation Note (Signed)
This note was copied from a baby's chart. Lactation Consultation Note  Patient Name: Kimberly Gomez Date: 10/15/2017 Reason for consult: Follow-up assessment;Preterm <34wks;Infant < 6lbs;NICU baby;Maternal endocrine disorder Type of Endocrine Disorder?: Thyroid  Mom with NICU baby, baby is now 67 weeks old with adjusted age, and per mom she has been attempting on latching baby to the breast for the past 2 days, but hasn't been successful about it yet, RN requested LC for assistance. Baby was already asleep when entering the NICU pod, and very difficult to arouse, per mom baby has already fed and she (baby) was napping.  Per RN baby has been taking 5 ml feeding orally from a bottle with a very slow flow artificial nipple during the last 2 days and she's been tolerating them well. Mom had baby STS, woke her up and expressed two drops of colostrum on her left nipple before latching her on. Baby was able to latch briefly on left breast in cross cradle positing but baby will not suck, she just stayed latch and fell asleep. Baby unlatched herself shortly  after that,gave mom some tips for a good latch.  Got mom sized in a # 20 NS to try in baby's next feeding, asked mom to call for assistance. Mom will keep pumping every 3 hours to provide breastmilk for her baby. Stressed the importance of trying the next feeding on cues when baby is ready. Both parents aware of [place] services and will call PRN.  Feeding Feeding Type: Breast Milk  LATCH Score Latch: Repeated attempts needed to sustain latch, nipple held in mouth throughout feeding, stimulation needed to elicit sucking reflex.  Audible Swallowing: A few with stimulation  Type of Nipple: Everted at rest and after stimulation  Comfort (Breast/Nipple): Soft / non-tender  Hold (Positioning): No assistance needed to correctly position infant at breast.  LATCH Score: 8  Interventions Interventions: Breast feeding basics  reviewed;Assisted with latch;Skin to skin;Breast massage;Hand express;Breast compression;Adjust position;Support pillows;Expressed milk  Lactation Tools Discussed/Used     Consult Status Consult Status: Follow-up Date: 10/16/17 Follow-up type: In-patient    August Gosser Francene Boyers 10/15/2017, 5:07 PM

## 2017-10-28 MED FILL — SYNTHROID 25 MCG TABLET: 25 | 90 days supply | Qty: 90 | Fill #2

## 2018-02-02 MED FILL — SYNTHROID 25 MCG TABLET: 25 | 90 days supply | Qty: 90 | Fill #3

## 2018-03-24 ENCOUNTER — Encounter: Payer: Self-pay | Admitting: Family Medicine

## 2018-04-27 DIAGNOSIS — L603 Nail dystrophy: Secondary | ICD-10-CM | POA: Diagnosis not present

## 2018-04-27 DIAGNOSIS — B351 Tinea unguium: Secondary | ICD-10-CM | POA: Diagnosis not present

## 2018-04-29 ENCOUNTER — Ambulatory Visit (INDEPENDENT_AMBULATORY_CARE_PROVIDER_SITE_OTHER): Payer: 59 | Admitting: Family Medicine

## 2018-04-29 ENCOUNTER — Encounter: Payer: Self-pay | Admitting: Family Medicine

## 2018-04-29 VITALS — BP 120/80 | HR 80 | Ht 66.0 in | Wt 140.8 lb

## 2018-04-29 DIAGNOSIS — Z0000 Encounter for general adult medical examination without abnormal findings: Secondary | ICD-10-CM | POA: Diagnosis not present

## 2018-04-29 DIAGNOSIS — Z8759 Personal history of other complications of pregnancy, childbirth and the puerperium: Secondary | ICD-10-CM

## 2018-04-29 LAB — POCT URINALYSIS DIP (PROADVANTAGE DEVICE)
BILIRUBIN UA: NEGATIVE
GLUCOSE UA: NEGATIVE mg/dL
Ketones, POC UA: NEGATIVE mg/dL
Leukocytes, UA: NEGATIVE
Nitrite, UA: NEGATIVE
Protein Ur, POC: NEGATIVE mg/dL
RBC UA: NEGATIVE
Specific Gravity, Urine: 1.015
UUROB: NEGATIVE
pH, UA: 6 (ref 5.0–8.0)

## 2018-04-29 NOTE — Patient Instructions (Signed)
It was a pleasure seeing you today.  We will send you your lab results.   Preventative Care for Adults - Female      MAINTAIN REGULAR HEALTH EXAMS:  A routine yearly physical is a good way to check in with your primary care provider about your health and preventive screening. It is also an opportunity to share updates about your health and any concerns you have, and receive a thorough all-over exam.   Most health insurance companies pay for at least some preventative services.  Check with your health plan for specific coverages.  WHAT PREVENTATIVE SERVICES DO WOMEN NEED?  Adult women should have their weight and blood pressure checked regularly.   Women age 42 and older should have their cholesterol levels checked regularly.  Women should be screened for cervical cancer with a Pap smear and pelvic exam beginning at either age 8, or 3 years after they become sexually activity.    Breast cancer screening generally begins at age 46 with a mammogram and breast exam by your primary care provider.    Beginning at age 15 and continuing to age 73, women should be screened for colorectal cancer.  Certain people may need continued testing until age 42.  Updating vaccinations is part of preventative care.  Vaccinations help protect against diseases such as the flu.  Osteoporosis is a disease in which the bones lose minerals and strength as we age. Women ages 67 and over should discuss this with their caregivers, as should women after menopause who have other risk factors.  Lab tests are generally done as part of preventative care to screen for anemia and blood disorders, to screen for problems with the kidneys and liver, to screen for bladder problems, to check blood sugar, and to check your cholesterol level.  Preventative services generally include counseling about diet, exercise, avoiding tobacco, drugs, excessive alcohol consumption, and sexually transmitted infections.    GENERAL  RECOMMENDATIONS FOR GOOD HEALTH:  Healthy diet:  Eat a variety of foods, including fruit, vegetables, animal or vegetable protein, such as meat, fish, chicken, and eggs, or beans, lentils, tofu, and grains, such as rice.  Drink plenty of water daily.  Decrease saturated fat in the diet, avoid lots of red meat, processed foods, sweets, fast foods, and fried foods.  Exercise:  Aerobic exercise helps maintain good heart health. At least 30-40 minutes of moderate-intensity exercise is recommended. For example, a brisk walk that increases your heart rate and breathing. This should be done on most days of the week.   Find a type of exercise or a variety of exercises that you enjoy so that it becomes a part of your daily life.  Examples are running, walking, swimming, water aerobics, and biking.  For motivation and support, explore group exercise such as aerobic class, spin class, Zumba, Yoga,or  martial arts, etc.    Set exercise goals for yourself, such as a certain weight goal, walk or run in a race such as a 5k walk/run.  Speak to your primary care provider about exercise goals.  Disease prevention:  If you smoke or chew tobacco, find out from your caregiver how to quit. It can literally save your life, no matter how long you have been a tobacco user. If you do not use tobacco, never begin.   Maintain a healthy diet and normal weight. Increased weight leads to problems with blood pressure and diabetes.   The Body Mass Index or BMI is a way of measuring how  much of your body is fat. Having a BMI above 27 increases the risk of heart disease, diabetes, hypertension, stroke and other problems related to obesity. Your caregiver can help determine your BMI and based on it develop an exercise and dietary program to help you achieve or maintain this important measurement at a healthful level.  High blood pressure causes heart and blood vessel problems.  Persistent high blood pressure should be treated  with medicine if weight loss and exercise do not work.   Fat and cholesterol leaves deposits in your arteries that can block them. This causes heart disease and vessel disease elsewhere in your body.  If your cholesterol is found to be high, or if you have heart disease or certain other medical conditions, then you may need to have your cholesterol monitored frequently and be treated with medication.   Ask if you should have a cardiac stress test if your history suggests this. A stress test is a test done on a treadmill that looks for heart disease. This test can find disease prior to there being a problem.  Menopause can be associated with physical symptoms and risks. Hormone replacement therapy is available to decrease these. You should talk to your caregiver about whether starting or continuing to take hormones is right for you.   Osteoporosis is a disease in which the bones lose minerals and strength as we age. This can result in serious bone fractures. Risk of osteoporosis can be identified using a bone density scan. Women ages 72 and over should discuss this with their caregivers, as should women after menopause who have other risk factors. Ask your caregiver whether you should be taking a calcium supplement and Vitamin D, to reduce the rate of osteoporosis.   Avoid drinking alcohol in excess (more than two drinks per day).  Avoid use of street drugs. Do not share needles with anyone. Ask for professional help if you need assistance or instructions on stopping the use of alcohol, cigarettes, and/or drugs.  Brush your teeth twice a day with fluoride toothpaste, and floss once a day. Good oral hygiene prevents tooth decay and gum disease. The problems can be painful, unattractive, and can cause other health problems. Visit your dentist for a routine oral and dental check up and preventive care every 6-12 months.   Look at your skin regularly.  Use a mirror to look at your back. Notify your  caregivers of changes in moles, especially if there are changes in shapes, colors, a size larger than a pencil eraser, an irregular border, or development of new moles.  Safety:  Use seatbelts 100% of the time, whether driving or as a passenger.  Use safety devices such as hearing protection if you work in environments with loud noise or significant background noise.  Use safety glasses when doing any work that could send debris in to the eyes.  Use a helmet if you ride a bike or motorcycle.  Use appropriate safety gear for contact sports.  Talk to your caregiver about gun safety.  Use sunscreen with a SPF (or skin protection factor) of 15 or greater.  Lighter skinned people are at a greater risk of skin cancer. Don't forget to also wear sunglasses in order to protect your eyes from too much damaging sunlight. Damaging sunlight can accelerate cataract formation.   Practice safe sex. Use condoms. Condoms are used for birth control and to help reduce the spread of sexually transmitted infections (or STIs).  Some of the STIs  are gonorrhea (the clap), chlamydia, syphilis, trichomonas, herpes, HPV (human papilloma virus) and HIV (human immunodeficiency virus) which causes AIDS. The herpes, HIV and HPV are viral illnesses that have no cure. These can result in disability, cancer and death.   Keep carbon monoxide and smoke detectors in your home functioning at all times. Change the batteries every 6 months or use a model that plugs into the wall.   Vaccinations:  Stay up to date with your tetanus shots and other required immunizations. You should have a booster for tetanus every 10 years. Be sure to get your flu shot every year, since 5%-20% of the [place] population comes down with the flu. The flu vaccine changes each year, so being vaccinated once is not enough. Get your shot in the fall, before the flu season peaks.   Other vaccines to consider:  Human Papilloma Virus or HPV causes cancer of the cervix,  and other infections that can be transmitted from person to person. There is a vaccine for HPV, and females should get immunized between the ages of 14 and 80. It requires a series of 3 shots.   Pneumococcal vaccine to protect against certain types of pneumonia.  This is normally recommended for adults age 28 or older.  However, adults younger than 32 years old with certain underlying conditions such as diabetes, heart or lung disease should also receive the vaccine.  Shingles vaccine to protect against Varicella Zoster if you are older than age 38, or younger than 32 years old with certain underlying illness.  Hepatitis A vaccine to protect against a form of infection of the liver by a virus acquired from food.  Hepatitis B vaccine to protect against a form of infection of the liver by a virus acquired from blood or body fluids, particularly if you work in health care.  If you plan to travel internationally, check with your local health department for specific vaccination recommendations.  Cancer Screening:  Breast cancer screening is essential to preventive care for women. All women age 77 and older should perform a breast self-exam every month. At age 46 and older, women should have their caregiver complete a breast exam each year. Women at ages 25 and older should have a mammogram (x-ray film) of the breasts. Your caregiver can discuss how often you need mammograms.    Cervical cancer screening includes taking a Pap smear (sample of cells examined under a microscope) from the cervix (end of the uterus). It also includes testing for HPV (Human Papilloma Virus, which can cause cervical cancer). Screening and a pelvic exam should begin at age 53, or 3 years after a woman becomes sexually active. Screening should occur every year, with a Pap smear but no HPV testing, up to age 44. After age 60, you should have a Pap smear every 3 years with HPV testing, if no HPV was found previously.   Most  routine colon cancer screening begins at the age of 14. On a yearly basis, doctors may provide special easy to use take-home tests to check for hidden blood in the stool. Sigmoidoscopy or colonoscopy can detect the earliest forms of colon cancer and is life saving. These tests use a small camera at the end of a tube to directly examine the colon. Speak to your caregiver about this at age 25, when routine screening begins (and is repeated every 5 years unless early forms of pre-cancerous polyps or small growths are found).

## 2018-04-29 NOTE — Progress Notes (Signed)
Subjective:    Patient ID: Kimberly Gomez, female    DOB: 11/12/1985, 32 y.o.   MRN: [id number]  HPI Chief Complaint  Patient presents with  . cpe    cpe, wants to talk about getting off thyroid medicine.    She is here for a complete physical exam. She had a daughter 6 1/2 months ago. States she and her baby are doing well. She is breast feeding.   Gestational hypertension. Stopped her medication after delivery and her BP has been normal.   States she stopped levothyroxine  2 weeks ago. States she was on this for fertility issues. Her thyroid function was normal.    Other providers: Dr. 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 Runs sometimes   Immunizations: Tdap updated in pregnancy. Flu shot UTD. [employer] employee  Health maintenance:  Mammogram: N/A Colonoscopy: N/A Last Gynecological Exam: OB/GYN last year  Last Menstrual cycle: recent pregnancy  Pregnancies: 1 Last Dental Exam: twice annually  Last Eye Exam: March 2019   Wears seatbelt always, uses sunscreen, smoke detectors in home and functioning, does not text while driving and feels safe in home environment.   Reviewed allergies, medications, past medical, surgical, family, and social history.    Review of Systems Review of Systems Constitutional: -fever, -chills, -sweats, -unexpected weight change,-fatigue ENT: -runny nose, -ear pain, -sore throat Cardiology:  -chest pain, -palpitations, -edema Respiratory: -cough, -shortness of breath, -wheezing Gastroenterology: -abdominal pain, -nausea, -vomiting, -diarrhea, -constipation  Hematology: -bleeding or bruising problems Musculoskeletal: -arthralgias, -myalgias, -joint swelling, -back pain Ophthalmology: -vision changes Urology: -dysuria, -difficulty urinating, -hematuria, -urinary frequency, -urgency Neurology: -headache,  -weakness, -tingling, -numbness       Objective:   Physical Exam BP 120/80   Pulse 80   Ht 5\' 6"  (1.676 m)   Wt 140 lb 12.8 oz (63.9 kg)   Breastfeeding? Yes   BMI 22.73 kg/m   General Appearance:    Alert, cooperative, no distress, appears stated age  Head:    Normocephalic, without obvious abnormality, atraumatic  Eyes:    PERRL, conjunctiva/corneas clear, EOM's intact, fundi    benign  Ears:    Normal TM's and external ear canals  Nose:   Nares normal, mucosa normal, no drainage or sinus   tenderness  Throat:   Lips, mucosa, and tongue normal; teeth and gums normal  Neck:   Supple, no lymphadenopathy;  thyroid:  no   enlargement/tenderness/nodules; no carotid   bruit or JVD  Back:    Spine nontender, no curvature, ROM normal, no CVA     tenderness  Lungs:     Clear to auscultation bilaterally without wheezes, rales or     ronchi; respirations unlabored  Chest Wall:    No tenderness or deformity   Heart:    Regular rate and rhythm, S1 and S2 normal, no murmur, rub   or gallop  Breast Exam:    OB/GYN  Abdomen:     Soft, non-tender, nondistended, normoactive bowel sounds,    no masses, no hepatosplenomegaly  Genitalia:    OB/GYN  Rectal:    Not performed due to age<40 and no related complaints  Extremities:   No clubbing, cyanosis or edema  Pulses:   2+ and symmetric all extremities  Skin:   Skin color, texture, turgor normal, no rashes or lesions  Lymph nodes:  Cervical, supraclavicular, and axillary nodes normal  Neurologic:   CNII-XII intact, normal strength, sensation and gait; reflexes 2+ and symmetric throughout          Psych:   Normal mood, affect, hygiene and grooming.     Urinalysis dipstick: negative       Assessment & Plan:  Routine general medical examination at a health care facility - Plan: CBC with Differential/Platelet, Comprehensive metabolic panel, POCT Urinalysis DIP (Proadvantage Device), TSH, T4, free  History of gestational hypertension  She  is doing well.  Recent pregnancy and is breast-feeding without any difficulties. Gestational hypertension that resolved after delivery.  Is not currently on medication and does keep an eye on her blood pressure at home.  Blood pressure is normal today Counseling done on healthy diet and exercise. She was taking levothyroxine due to fertility issues and would like to stop the medication.  She denies history of hypothyroidism.  She did stop the medication 2 weeks ago and we will recheck thyroid panel today. Immunizations up-to-date. Follow-up pending labs

## 2018-04-30 LAB — CBC WITH DIFFERENTIAL/PLATELET
BASOS: 1 %
Basophils Absolute: 0.1 10*3/uL (ref 0.0–0.2)
EOS (ABSOLUTE): 0.1 10*3/uL (ref 0.0–0.4)
EOS: 1 %
HEMATOCRIT: 36.8 % (ref 34.0–46.6)
Hemoglobin: 13 g/dL (ref 11.1–15.9)
IMMATURE GRANS (ABS): 0 10*3/uL (ref 0.0–0.1)
Immature Granulocytes: 0 %
Lymphocytes Absolute: 2.4 10*3/uL (ref 0.7–3.1)
Lymphs: 28 %
MCH: 30.3 pg (ref 26.6–33.0)
MCHC: 35.3 g/dL (ref 31.5–35.7)
MCV: 86 fL (ref 79–97)
MONOS ABS: 0.8 10*3/uL (ref 0.1–0.9)
Monocytes: 9 %
NEUTROS ABS: 5.2 10*3/uL (ref 1.4–7.0)
Neutrophils: 61 %
PLATELETS: 358 10*3/uL (ref 150–450)
RBC: 4.29 x10E6/uL (ref 3.77–5.28)
RDW: 12.7 % (ref 12.3–15.4)
WBC: 8.5 10*3/uL (ref 3.4–10.8)

## 2018-04-30 LAB — COMPREHENSIVE METABOLIC PANEL
A/G RATIO: 1.3 (ref 1.2–2.2)
ALT: 10 IU/L (ref 0–32)
AST: 17 IU/L (ref 0–40)
Albumin: 4.5 g/dL (ref 3.5–5.5)
Alkaline Phosphatase: 83 IU/L (ref 39–117)
BILIRUBIN TOTAL: 1.2 mg/dL (ref 0.0–1.2)
BUN/Creatinine Ratio: 13 (ref 9–23)
BUN: 12 mg/dL (ref 6–20)
CO2: 24 mmol/L (ref 20–29)
Calcium: 9.7 mg/dL (ref 8.7–10.2)
Chloride: 103 mmol/L (ref 96–106)
Creatinine, Ser: 0.95 mg/dL (ref 0.57–1.00)
GFR, EST AFRICAN AMERICAN: 92 mL/min/{1.73_m2} (ref >59)
GFR, EST NON AFRICAN AMERICAN: 80 mL/min/{1.73_m2} (ref >59)
GLOBULIN, TOTAL: 3.4 g/dL (ref 1.5–4.5)
Glucose: 89 mg/dL (ref 65–99)
POTASSIUM: 4.2 mmol/L (ref 3.5–5.2)
Sodium: 140 mmol/L (ref 134–144)
TOTAL PROTEIN: 7.9 g/dL (ref 6.0–8.5)

## 2018-04-30 LAB — T4, FREE: Free T4: 1.17 ng/dL (ref 0.82–1.77)

## 2018-04-30 LAB — TSH: TSH: 2.28 u[IU]/mL (ref 0.450–4.500)

## 2018-05-11 DIAGNOSIS — Z808 Family history of malignant neoplasm of other organs or systems: Secondary | ICD-10-CM | POA: Diagnosis not present

## 2018-05-11 DIAGNOSIS — L603 Nail dystrophy: Secondary | ICD-10-CM | POA: Diagnosis not present

## 2018-05-11 DIAGNOSIS — B351 Tinea unguium: Secondary | ICD-10-CM | POA: Diagnosis not present

## 2018-05-11 DIAGNOSIS — D2270 Melanocytic nevi of unspecified lower limb, including hip: Secondary | ICD-10-CM | POA: Diagnosis not present

## 2018-05-11 DIAGNOSIS — L821 Other seborrheic keratosis: Secondary | ICD-10-CM | POA: Diagnosis not present

## 2018-05-11 DIAGNOSIS — L814 Other melanin hyperpigmentation: Secondary | ICD-10-CM | POA: Diagnosis not present

## 2018-05-25 ENCOUNTER — Telehealth: Payer: Self-pay | Admitting: Family Medicine

## 2018-05-25 NOTE — Telephone Encounter (Signed)
Received requested records from [facility]. Sending back for review.

## 2018-06-01 ENCOUNTER — Encounter: Payer: Self-pay | Admitting: Internal Medicine

## 2018-07-28 ENCOUNTER — Telehealth: Payer: 59 | Admitting: Nurse Practitioner

## 2018-07-28 DIAGNOSIS — J00 Acute nasopharyngitis [common cold]: Secondary | ICD-10-CM

## 2018-07-28 NOTE — Progress Notes (Signed)
We are sorry you are not feeling well.  Here is how we plan to help! Spoke with patient on phone to clarify symptoms. Decided that probably viral. Patient was toold to contact me Friday evening via my chart if no better.  Based on what you have shared with me, it looks like you may have a viral upper respiratory infection or a "common cold".  Colds are caused by a large number of viruses; however, rhinovirus is the most common cause.   Symptoms of the common cold vary from person to person, with common symptoms including sore throat, cough, and malaise.  A low-grade fever of 100.4 may present, but is often uncommon.  Symptoms vary however, and are closely related to a person's age or underlying illnesses.  The most common symptoms associated with the common cold are nasal discharge or congestion, cough, sneezing, headache and pressure in the ears and face.  Cold symptoms usually persist for about 3 to 10 days, but can last up to 2 weeks.  It is important to know that colds do not cause serious illness or complications in most cases.    The common cold is transmitted from person to person, with the most common method of transmission being a person's hands.  The virus is able to live on the skin and can infect other persons for up to 2 hours after direct contact.  Also, colds are transmitted when someone coughs or sneezes; thus, it is important to cover the mouth to reduce this risk.  To keep the spread of the common cold at [place], good hand hygiene is very important.  This is an infection that is most likely caused by a virus. There are no specific treatments for the common cold other than to help you with the symptoms until the infection runs its course.    For nasal congestion, you may use an oral decongestants such as Mucinex D or if you have glaucoma or high blood pressure use plain Mucinex.  Saline nasal spray or nasal drops can help and can safely be used as often as needed for congestion.    If you  do not have a history of heart disease, hypertension, diabetes or thyroid disease, prostate/bladder issues or glaucoma, you may also use Sudafed to treat nasal congestion.  It is highly recommended that you consult with a pharmacist or your primary care physician to ensure this medication is safe for you to take.     If you have a cough, you may use cough suppressants such as Delsym and Robitussin.  If you have glaucoma or high blood pressure, you can also use Coricidin HBP.     If you have a sore or scratchy throat, use a saltwater gargle-  to  teaspoon of salt dissolved in a 4-ounce to 8-ounce glass of warm water.  Gargle the solution for approximately 15-30 seconds and then spit.  It is important not to swallow the solution.  You can also use throat lozenges/cough drops and Chloraseptic spray to help with throat pain or discomfort.  Warm or cold liquids can also be helpful in relieving throat pain.  For headache, pain or general discomfort, you can use Ibuprofen or Tylenol as directed.   Some authorities believe that zinc sprays or the use of Echinacea may shorten the course of your symptoms.   HOME CARE . Only take medications as instructed by your medical team. . Be sure to drink plenty of fluids. Water is fine as well as fruit  juices, sodas and electrolyte beverages. You may want to stay away from caffeine or alcohol. If you are nauseated, try taking small sips of liquids. How do you know if you are getting enough fluid? Your urine should be a pale yellow or almost colorless. . Get rest. . Taking a steamy shower or using a humidifier may help nasal congestion and ease sore throat pain. You can place a towel over your head and breathe in the steam from hot water coming from a faucet. . Using a saline nasal spray works much the same way. . Cough drops, hard candies and sore throat lozenges may ease your cough. . Avoid close contacts especially the very young and the elderly . Cover your  mouth if you cough or sneeze . Always remember to wash your hands.   GET HELP RIGHT AWAY IF: . You develop worsening fever. . If your symptoms do not improve within 10 days . You develop yellow or green discharge from your nose over 3 days. . You have coughing fits . You develop a severe head ache or visual changes. . You develop shortness of breath, difficulty breathing or start having chest pain . Your symptoms persist after you have completed your treatment plan  MAKE SURE YOU   Understand these instructions.  Will watch your condition.  Will get help right away if you are not doing well or get worse.  Your e-visit answers were reviewed by a board certified advanced clinical practitioner to complete your personal care plan. Depending upon the condition, your plan could have included both over the counter or prescription medications. Please review your pharmacy choice. If there is a problem, you may call our nursing hot line at and have the prescription routed to another pharmacy. Your safety is important to [place]. If you have drug allergies check your prescription carefully.   You can use MyChart to ask questions about today's visit, request a non-urgent call back, or ask for a work or school excuse for 24 hours related to this e-Visit. If it has been greater than 24 hours you will need to follow up with your provider, or enter a new e-Visit to address those concerns. You will get an e-mail in the next two days asking about your experience.  I hope that your e-visit has been valuable and will speed your recovery. Thank you for using e-visits.   7 minutes spent reviewing and documenting in chart. Had to contact patient by phone for clarification.

## 2018-08-11 ENCOUNTER — Encounter: Payer: Self-pay | Admitting: Family Medicine

## 2018-08-12 DIAGNOSIS — B351 Tinea unguium: Secondary | ICD-10-CM | POA: Diagnosis not present

## 2018-09-09 DIAGNOSIS — H5213 Myopia, bilateral: Secondary | ICD-10-CM | POA: Diagnosis not present

## 2018-09-09 DIAGNOSIS — H52203 Unspecified astigmatism, bilateral: Secondary | ICD-10-CM | POA: Diagnosis not present

## 2018-11-03 DIAGNOSIS — N979 Female infertility, unspecified: Secondary | ICD-10-CM | POA: Diagnosis not present

## 2018-11-08 DIAGNOSIS — Z1329 Encounter for screening for other suspected endocrine disorder: Secondary | ICD-10-CM | POA: Diagnosis not present

## 2018-12-02 DIAGNOSIS — B351 Tinea unguium: Secondary | ICD-10-CM | POA: Diagnosis not present

## 2018-12-29 ENCOUNTER — Telehealth: Payer: 59 | Admitting: Family

## 2018-12-29 DIAGNOSIS — R197 Diarrhea, unspecified: Secondary | ICD-10-CM

## 2018-12-29 MED ORDER — ONDANSETRON HCL 4 MG PO TABS: 4.0000 mg | ORAL_TABLET | Freq: Three times a day (TID) | ORAL | 0 refills | Status: DC | PRN

## 2018-12-29 NOTE — Progress Notes (Signed)
We are sorry that you are not feeling well.  Here is how we plan to help!  Approximately 5 minutes was spent documenting and reviewing patient's chart.    Based on what you have shared with me it looks like you have Acute Infectious Diarrhea.  Most cases of acute diarrhea are due to infections with virus and bacteria and are self-limited conditions lasting less than 14 days.  For your symptoms you may take Imodium 2 mg tablets that are over the counter at your local pharmacy. Take two tablet now and then one after each loose stool up to 6 a day.  Antibiotics are not needed for most people with diarrhea.   Zofran 4 mg 1 tablet every 8 hours as needed for nausea and vomiting and you can also take Imodium as needed for loose stools.  HOME CARE  We recommend changing your diet to help with your symptoms for the next few days.  Drink plenty of fluids that contain water salt and sugar. Sports drinks such as Gatorade may help.   You may try broths, soups, bananas, applesauce, soft breads, mashed potatoes or crackers.   You are considered infectious for as long as the diarrhea continues. Hand washing or use of alcohol based hand sanitizers is recommend.  It is best to stay out of work or school until your symptoms stop.   GET HELP RIGHT AWAY  If you have dark yellow colored urine or do not pass urine frequently you should drink more fluids.    If your symptoms worsen   If you feel like you are going to pass out (faint)  You have a new problem  MAKE SURE YOU   Understand these instructions.  Will watch your condition.  Will get help right away if you are not doing well or get worse.  Your e-visit answers were reviewed by a board certified advanced clinical practitioner to complete your personal care plan.  Depending on the condition, your plan could have included both over the counter or prescription medications.  If there is a problem please reply  once you have received a  response from your provider.  Your safety is important to [place].  If you have drug allergies check your prescription carefully.    You can use MyChart to ask questions about today's visit, request a non-urgent call back, or ask for a work or school excuse for 24 hours related to this e-Visit. If it has been greater than 24 hours you will need to follow up with your provider, or enter a new e-Visit to address those concerns.   You will get an e-mail in the next two days asking about your experience.  I hope that your e-visit has been valuable and will speed your recovery. Thank you for using e-visits.

## 2019-01-10 DIAGNOSIS — Z319 Encounter for procreative management, unspecified: Secondary | ICD-10-CM | POA: Diagnosis not present

## 2019-01-18 DIAGNOSIS — L639 Alopecia areata, unspecified: Secondary | ICD-10-CM | POA: Diagnosis not present

## 2019-02-04 DIAGNOSIS — N979 Female infertility, unspecified: Secondary | ICD-10-CM | POA: Diagnosis not present

## 2019-02-23 DIAGNOSIS — Z113 Encounter for screening for infections with a predominantly sexual mode of transmission: Secondary | ICD-10-CM | POA: Diagnosis not present

## 2019-02-23 DIAGNOSIS — E559 Vitamin D deficiency, unspecified: Secondary | ICD-10-CM | POA: Diagnosis not present

## 2019-02-23 DIAGNOSIS — N978 Female infertility of other origin: Secondary | ICD-10-CM | POA: Diagnosis not present

## 2019-02-24 DIAGNOSIS — R946 Abnormal results of thyroid function studies: Secondary | ICD-10-CM | POA: Diagnosis not present

## 2019-03-01 MED FILL — MENOPUR 75 UNIT VIAL: 75 | 10 days supply | Qty: 10 | Fill #0

## 2019-03-01 MED FILL — GONAL-F RFF REDI-JECT 900 U: 900 | 10 days supply | Qty: 8 | Fill #0

## 2019-03-01 MED FILL — CHORIONIC GONAD 10,000 UNIT: 10000 | 30 days supply | Qty: 1 | Fill #0

## 2019-03-01 MED FILL — BD 3 ML SYRINGE 18GX1-1/2: 18G X 1-1/2 | 20 days supply | Qty: 20 | Fill #0

## 2019-03-01 MED FILL — GONAL-F RFF REDI-JECT 450 U: 450 | 1 days supply | Qty: 1 | Fill #0

## 2019-03-01 MED FILL — BD NEEDLES 27GX0.5: 27G X 1/2" | 20 days supply | Qty: 20 | Fill #0

## 2019-03-03 DIAGNOSIS — L639 Alopecia areata, unspecified: Secondary | ICD-10-CM | POA: Diagnosis not present

## 2019-03-04 MED FILL — NOVAREL 5000 UNIT SOLR: 5000 | 1 days supply | Qty: 2 | Fill #0

## 2019-03-07 MED FILL — ENDOMETRIN 100 MG SUPP: 100 | 14 days supply | Qty: 42 | Fill #0

## 2019-03-27 ENCOUNTER — Encounter: Payer: Self-pay | Admitting: Family Medicine

## 2019-03-27 ENCOUNTER — Telehealth: Payer: 59 | Admitting: Family

## 2019-03-27 DIAGNOSIS — J0100 Acute maxillary sinusitis, unspecified: Secondary | ICD-10-CM

## 2019-03-27 MED ORDER — DOXYCYCLINE HYCLATE 100 MG PO TABS: 100.0000 mg | ORAL_TABLET | Freq: Two times a day (BID) | ORAL | 0 refills | Status: DC

## 2019-03-27 NOTE — Progress Notes (Signed)
We are sorry that you are not feeling well.  Here is how we plan to help!  Based on what you have shared with me it looks like you have sinusitis.  Sinusitis is inflammation and infection in the sinus cavities of the head.  Based on your presentation I believe you most likely have Acute Bacterial Sinusitis.  This is an infection caused by bacteria and is treated with antibiotics. I have prescribed Doxycycline 100mg by mouth twice a day for 10 days. You may use an oral decongestant such as Mucinex D or if you have glaucoma or high blood pressure use plain Mucinex. Saline nasal spray help and can safely be used as often as needed for congestion.  If you develop worsening sinus pain, fever or notice severe headache and vision changes, or if symptoms are not better after completion of antibiotic, please schedule an appointment with a health care provider.    Sinus infections are not as easily transmitted as other respiratory infection, however we still recommend that you avoid close contact with loved ones, especially the very young and elderly.  Remember to wash your hands thoroughly throughout the day as this is the number one way to prevent the spread of infection!  Home Care:  Only take medications as instructed by your medical team.  Complete the entire course of an antibiotic.  Do not take these medications with alcohol.  A steam or ultrasonic humidifier can help congestion.  You can place a towel over your head and breathe in the steam from hot water coming from a faucet.  Avoid close contacts especially the very young and the elderly.  Cover your mouth when you cough or sneeze.  Always remember to wash your hands.  Get Help Right Away If:  You develop worsening fever or sinus pain.  You develop a severe head ache or visual changes.  Your symptoms persist after you have completed your treatment plan.  Make sure you  Understand these instructions.  Will watch your  condition.  Will get help right away if you are not doing well or get worse.  Your e-visit answers were reviewed by a board certified advanced clinical practitioner to complete your personal care plan.  Depending on the condition, your plan could have included both over the counter or prescription medications.  If there is a problem please reply  once you have received a response from your provider.  Your safety is important to us.  If you have drug allergies check your prescription carefully.    You can use MyChart to ask questions about today's visit, request a non-urgent call back, or ask for a work or school excuse for 24 hours related to this e-Visit. If it has been greater than 24 hours you will need to follow up with your provider, or enter a new e-Visit to address those concerns.  You will get an e-mail in the next two days asking about your experience.  I hope that your e-visit has been valuable and will speed your recovery. Thank you for using e-visits.  Greater than 5 minutes, yet less than 10 minutes of time have been spent researching, coordinating, and implementing care for this patient today.  Thank you for the details you included in the comment boxes. Those details are very helpful in determining the best course of treatment for you and help us to provide the best care.  

## 2019-04-01 ENCOUNTER — Encounter: Payer: Self-pay | Admitting: Family Medicine

## 2019-04-01 ENCOUNTER — Ambulatory Visit (INDEPENDENT_AMBULATORY_CARE_PROVIDER_SITE_OTHER): Payer: 59 | Admitting: Family Medicine

## 2019-04-01 ENCOUNTER — Other Ambulatory Visit: Payer: Self-pay

## 2019-04-01 VITALS — BP 98/68 | HR 97 | Temp 97.6°F | Wt 136.0 lb

## 2019-04-01 DIAGNOSIS — J019 Acute sinusitis, unspecified: Secondary | ICD-10-CM | POA: Diagnosis not present

## 2019-04-01 DIAGNOSIS — R05 Cough: Secondary | ICD-10-CM | POA: Diagnosis not present

## 2019-04-01 DIAGNOSIS — R059 Cough, unspecified: Secondary | ICD-10-CM

## 2019-04-01 MED ORDER — PREDNISONE 10 MG (21) PO TBPK: ORAL_TABLET | Freq: Every day | ORAL | 0 refills | Status: DC

## 2019-04-01 NOTE — Progress Notes (Signed)
   Subjective:  Documentation for virtual audio and video telecommunications through [place] encounter:  The patient was located at home. 2 patient identifiers used.  The provider was located in the office. The patient did consent to this visit and is aware of possible charges through their insurance for this visit.  The other persons participating in this telemedicine service were her husband.    Patient ID: Kimberly Gomez, female    DOB: 07/16/1985, 33 y.o.   MRN: [id number]  HPI Chief Complaint  Patient presents with  . coughing    coughing and headache and aniexty. on sinus infection med- helping but now moving down to chest- belging, sensation in chest and feeling tired. got sick form her daughter 2 weeks ago   Complains of a 15 day history of rhinorrhea, nasal congestion which progressed to frontal headache, purulent nasal drainage, facial pain and now coughing and having moderate to severe fatigue.  States she had an E-visit last week and was diagnosed with sinusitis. She is currently taking a course of Doxycycline. States she has taken 5 days of it and seems to be improving overall.  She has also been taking Nyquil and Advil.  States frontal headache is still present along with fatigue, cough and post nasal drainage.  Requests additional medication to help her recover.   States her child was sick with similar symptoms as well. Her husband is well. No known Covid-19 exposures.   Denies fever, chills, body aches, sore throat, chest pain, palpitations, shortness of breath, abdominal pain, N/V/D.   No loss of taste or smell.  She does not feel that she has been hydrating enough. She is eating.   LMP: last month.  On birth control. Planning to start on IVF in a couple of weeks.   Reviewed allergies, medications, past medical, surgical, family, and social history.   Review of Systems Pertinent positives and negatives in the history of present illness.     Objective:   Physical Exam BP 98/68   Pulse 97   Temp 97.6 F (36.4 C)   Wt 136 lb (61.7 kg)   SpO2 100%   Breastfeeding No   BMI 21.95 kg/m   Alert and oriented and in no acute distress.  Respirations are unlabored.  Denies facial pain.  Dry cough is present.      Assessment & Plan:  Acute sinusitis treated with antibiotics in the past 60 days - Plan: predniSONE (STERAPRED UNI-PAK 21 TAB) 10 MG (21) TBPK tablet  Cough - Plan: predniSONE (STERAPRED UNI-PAK 21 TAB) 10 MG (21) TBPK tablet  Recommend she continue doxycycline until complete.  Discussed options of COVID 19 testing and she declines. Discussed adding oral steroids and she would like to do this.  Discussed how to take the tapering dose pack and potential side effects.  I also recommend that she increase her fluids, rest, switch from Advil to Tylenol and switch from NyQuil to Mucinex. She will call if she is worsening or not significantly improved by next week. We did discuss red flag symptoms and when to seek immediate medical attention.  Time spent on call was 16 minutes and in review of previous records 2 minutes total.  This virtual service is not related to other E/M service within previous 7 days.

## 2019-04-12 DIAGNOSIS — Z3183 Encounter for assisted reproductive fertility procedure cycle: Secondary | ICD-10-CM | POA: Diagnosis not present

## 2019-04-18 DIAGNOSIS — Z3183 Encounter for assisted reproductive fertility procedure cycle: Secondary | ICD-10-CM | POA: Diagnosis not present

## 2019-04-20 DIAGNOSIS — Z3183 Encounter for assisted reproductive fertility procedure cycle: Secondary | ICD-10-CM | POA: Diagnosis not present

## 2019-04-22 DIAGNOSIS — Z3183 Encounter for assisted reproductive fertility procedure cycle: Secondary | ICD-10-CM | POA: Diagnosis not present

## 2019-04-22 DIAGNOSIS — N979 Female infertility, unspecified: Secondary | ICD-10-CM | POA: Diagnosis not present

## 2019-04-27 DIAGNOSIS — Z3183 Encounter for assisted reproductive fertility procedure cycle: Secondary | ICD-10-CM | POA: Diagnosis not present

## 2019-05-04 MED FILL — ENDOMETRIN 100 MG SUPP: 100 | 14 days supply | Qty: 42 | Fill #1

## 2019-05-05 DIAGNOSIS — Z3200 Encounter for pregnancy test, result unknown: Secondary | ICD-10-CM | POA: Diagnosis not present

## 2019-06-03 MED FILL — GONAL-F RFF REDI-JECT 450 U: 450 | 30 days supply | Qty: 1 | Fill #0

## 2019-06-03 MED FILL — MENOPUR 75 UNIT VIAL: 75 | 5 days supply | Qty: 5 | Fill #0

## 2019-06-03 MED FILL — ULTICARE SYR 0.5 ML 31GX5/1: 31G X 5/16" | 30 days supply | Qty: 50 | Fill #0

## 2019-06-03 MED FILL — CRINONE 8 % GEL: 8 | 15 days supply | Qty: 17 | Fill #0

## 2019-06-03 MED FILL — BD 3 ML SYRINGE WITH NEEDLE: 21G X 1" | 30 days supply | Qty: 20 | Fill #0

## 2019-06-03 MED FILL — GONAL-F RFF REDI-JECT 900 U: 900 | 30 days supply | Qty: 2 | Fill #0

## 2019-06-03 MED FILL — BD NEEDLES 27GX0.5: 27G X 1/2" | 30 days supply | Qty: 20 | Fill #0

## 2019-06-07 MED FILL — PREGNYL 10,000 UNITS VIAL: 10000 | 1 days supply | Qty: 1 | Fill #0

## 2019-06-15 DIAGNOSIS — Z3183 Encounter for assisted reproductive fertility procedure cycle: Secondary | ICD-10-CM | POA: Diagnosis not present

## 2019-06-22 DIAGNOSIS — Z3183 Encounter for assisted reproductive fertility procedure cycle: Secondary | ICD-10-CM | POA: Diagnosis not present

## 2019-06-25 DIAGNOSIS — Z3183 Encounter for assisted reproductive fertility procedure cycle: Secondary | ICD-10-CM | POA: Diagnosis not present

## 2019-06-26 DIAGNOSIS — N978 Female infertility of other origin: Secondary | ICD-10-CM | POA: Diagnosis not present

## 2019-06-28 DIAGNOSIS — Z3183 Encounter for assisted reproductive fertility procedure cycle: Secondary | ICD-10-CM | POA: Diagnosis not present

## 2019-07-03 DIAGNOSIS — Z3183 Encounter for assisted reproductive fertility procedure cycle: Secondary | ICD-10-CM | POA: Diagnosis not present

## 2019-07-12 DIAGNOSIS — Z3200 Encounter for pregnancy test, result unknown: Secondary | ICD-10-CM | POA: Diagnosis not present

## 2019-07-19 DIAGNOSIS — N979 Female infertility, unspecified: Secondary | ICD-10-CM | POA: Diagnosis not present

## 2019-08-01 ENCOUNTER — Ambulatory Visit (INDEPENDENT_AMBULATORY_CARE_PROVIDER_SITE_OTHER): Payer: 59 | Admitting: Family Medicine

## 2019-08-01 ENCOUNTER — Other Ambulatory Visit: Payer: Self-pay

## 2019-08-01 ENCOUNTER — Encounter: Payer: Self-pay | Admitting: Family Medicine

## 2019-08-01 VITALS — BP 124/84 | HR 88 | Ht 66.75 in | Wt 140.8 lb

## 2019-08-01 DIAGNOSIS — Z0000 Encounter for general adult medical examination without abnormal findings: Secondary | ICD-10-CM | POA: Diagnosis not present

## 2019-08-01 DIAGNOSIS — R142 Eructation: Secondary | ICD-10-CM

## 2019-08-01 NOTE — Patient Instructions (Addendum)
Look for food triggers for the belching. If you would like to try an antacid to see if this helps, you could get over the counter Pepcid or omeprazole or Nexium.  Let me know if this becomes more bothersome.    Preventive Care 30-34 Years Old, Female Preventive care refers to visits with your health care provider and lifestyle choices that can promote health and wellness. This includes:  A yearly physical exam. This may also be called an annual well check.  Regular dental visits and eye exams.  Immunizations.  Screening for certain conditions.  Healthy lifestyle choices, such as eating a healthy diet, getting regular exercise, not using drugs or products that contain nicotine and tobacco, and limiting alcohol use. What can I expect for my preventive care visit? Physical exam Your health care provider will check your:  Height and weight. This may be used to calculate body mass index (BMI), which tells if you are at a healthy weight.  Heart rate and blood pressure.  Skin for abnormal spots. Counseling Your health care provider may ask you questions about your:  Alcohol, tobacco, and drug use.  Emotional well-being.  Home and relationship well-being.  Sexual activity.  Eating habits.  Work and work Statistician.  Method of birth control.  Menstrual cycle.  Pregnancy history. What immunizations do I need?  Influenza (flu) vaccine  This is recommended every year. Tetanus, diphtheria, and pertussis (Tdap) vaccine  You may need a Td booster every 10 years. Varicella (chickenpox) vaccine  You may need this if you have not been vaccinated. Human papillomavirus (HPV) vaccine  If recommended by your health care provider, you may need three doses over 6 months. Measles, mumps, and rubella (MMR) vaccine  You may need at least one dose of MMR. You may also need a second dose. Meningococcal conjugate (MenACWY) vaccine  One dose is recommended if you are age 61-21 years  and a first-year college student living in a residence hall, or if you have one of several medical conditions. You may also need additional booster doses. Pneumococcal conjugate (PCV13) vaccine  You may need this if you have certain conditions and were not previously vaccinated. Pneumococcal polysaccharide (PPSV23) vaccine  You may need one or two doses if you smoke cigarettes or if you have certain conditions. Hepatitis A vaccine  You may need this if you have certain conditions or if you travel or work in places where you may be exposed to hepatitis A. Hepatitis B vaccine  You may need this if you have certain conditions or if you travel or work in places where you may be exposed to hepatitis B. Haemophilus influenzae type b (Hib) vaccine  You may need this if you have certain conditions. You may receive vaccines as individual doses or as more than one vaccine together in one shot (combination vaccines). Talk with your health care provider about the risks and benefits of combination vaccines. What tests do I need?  Blood tests  Lipid and cholesterol levels. These may be checked every 5 years starting at age 54.  Hepatitis C test.  Hepatitis B test. Screening  Diabetes screening. This is done by checking your blood sugar (glucose) after you have not eaten for a while (fasting).  Sexually transmitted disease (STD) testing.  BRCA-related cancer screening. This may be done if you have a family history of breast, ovarian, tubal, or peritoneal cancers.  Pelvic exam and Pap test. This may be done every 3 years starting at age 46.  Starting at age 74, this may be done every 5 years if you have a Pap test in combination with an HPV test. Talk with your health care provider about your test results, treatment options, and if necessary, the need for more tests. Follow these instructions at home: Eating and drinking   Eat a diet that includes fresh fruits and vegetables, whole grains,  lean protein, and low-fat dairy.  Take vitamin and mineral supplements as recommended by your health care provider.  Do not drink alcohol if: ? Your health care provider tells you not to drink. ? You are pregnant, may be pregnant, or are planning to become pregnant.  If you drink alcohol: ? Limit how much you have to 0-1 drink a day. ? Be aware of how much alcohol is in your drink. In the [place] one drink equals one 12 oz bottle of beer (355 mL), one 5 oz glass of wine (148 mL), or one 1 oz glass of hard liquor (44 mL). Lifestyle  Take daily care of your teeth and gums.  Stay active. Exercise for at least 30 minutes on 5 or more days each week.  Do not use any products that contain nicotine or tobacco, such as cigarettes, e-cigarettes, and chewing tobacco. If you need help quitting, ask your health care provider.  If you are sexually active, practice safe sex. Use a condom or other form of birth control (contraception) in order to prevent pregnancy and STIs (sexually transmitted infections). If you plan to become pregnant, see your health care provider for a preconception visit. What's next?  Visit your health care provider once a year for a well check visit.  Ask your health care provider how often you should have your eyes and teeth checked.  Stay up to date on all vaccines. This information is not intended to replace advice given to you by your health care provider. Make sure you discuss any questions you have with your health care provider. Document Revised: 02/18/2018 Document Reviewed: 02/18/2018 Elsevier Patient Education  2020 [employer].

## 2019-08-01 NOTE — Progress Notes (Signed)
Subjective:    Patient ID: Kimberly Gomez, female    DOB: Sep 14, 1985, 34 y.o.   MRN: [id number]  HPI Chief Complaint  Patient presents with  . fasting cpe    fasting cpe, no other concerns, sees ogbyn   She is here for a complete physical exam.  Other providers: OB/GYN- Dr. Philis Pique   Past medical history:  Gestational HTN -states BP at home is in normal range.   Belching without abdominal pain, dysphagia, heartburn or regurgitation. Normal bowel movements daily.  She does eat cheese and chocolate daily. ?GERD   Social history: Lives with husband and 60 year old, works as a PT for [employer]  Denies smoking, drinking alcohol, drug use  Diet: fairly healthy  Excerise: 3-4 days per week.   Immunizations: UTD  Health maintenance:  Mammogram: N/A Colonoscopy: N/A Last Gynecological Exam: getting frequent pelvic US  Last Dental Exam: last month  Last Eye Exam: March 2020   Wears seatbelt always, uses sunscreen, smoke detectors in home and functioning, does not text while driving and feels safe in home environment.   Reviewed allergies, medications, past medical, surgical, family, and social history.    Review of Systems Review of Systems Constitutional: -fever, -chills, -sweats, -unexpected weight change,-fatigue ENT: -runny nose, -ear pain, -sore throat Cardiology:  -chest pain, -palpitations, -edema Respiratory: -cough, -shortness of breath, -wheezing Gastroenterology: -abdominal pain, -nausea, -vomiting, -diarrhea, -constipation  Hematology: -bleeding or bruising problems Musculoskeletal: -arthralgias, -myalgias, -joint swelling, -back pain Ophthalmology: -vision changes Urology: -dysuria, -difficulty urinating, -hematuria, -urinary frequency, -urgency Neurology: -headache, -weakness, -tingling, -numbness       Objective:   Physical Exam BP 124/84   Pulse 88   Ht 5' 6.75" (1.695 m)   Wt 140 lb 12.8 oz (63.9 kg)   LMP 07/19/2019   Breastfeeding No   BMI  22.22 kg/m   General Appearance:    Alert, cooperative, no distress, appears stated age  Head:    Normocephalic, without obvious abnormality, atraumatic  Eyes:    PERRL, conjunctiva/corneas clear, EOM's intact  Ears:    Normal TM's and external ear canals  Nose:   Mask in place   Throat:   Mask in place   Neck:   Supple, no lymphadenopathy;  thyroid:  no   enlargement/tenderness/nodules; no JVD  Back:    ROM normal, no CVA     tenderness  Lungs:     Clear to auscultation bilaterally without wheezes, rales or     ronchi; respirations unlabored  Chest Wall:    No tenderness or deformity   Heart:    Regular rate and rhythm, S1 and S2 normal, no murmur, rub   or gallop  Breast Exam:    OB/GYN  Abdomen:     Soft, non-tender, nondistended, normoactive bowel sounds,    no masses, no hepatosplenomegaly  Genitalia:    OB/GYN  Rectal:    Not performed due to age<40 and no related complaints  Extremities:   No clubbing, cyanosis or edema  Pulses:   2+ and symmetric all extremities  Skin:   Skin color, texture, turgor normal, no rashes or lesions  Lymph nodes:   Cervical, supraclavicular, and axillary nodes normal  Neurologic:   CNII-XII intact, normal strength, sensation and gait          Psych:   Normal mood, affect, hygiene and grooming.         Assessment & Plan:  Routine general medical examination at a health care facility - Plan:  CBC with Differential/Platelet, Comprehensive metabolic panel, TSH, T4, free, T3  Belching  She is here today for CPE.  She is not fasting. Discussed preventive healthcare and she is up-to-date on her Pap smear.  She does see an OB/GYN and currently undergoing IVF treatments with infertility specialist. Discussed healthy lifestyle and she does appear to be taking good care of herself with a healthy diet and regular exercise. Discussed safety and health promotion. In regards to the belching with no other GI symptoms, discussed looking for food triggers.  She  may want to try avoiding cheese and chocolate for several days to see if this improves her symptoms.  She may try Pepcid to see if this helps as well.  She will let me know if this becomes more bothersome or frequent. Follow-up pending lab results

## 2019-08-02 LAB — CBC WITH DIFFERENTIAL/PLATELET
Basophils Absolute: 0.1 10*3/uL (ref 0.0–0.2)
Basos: 1 %
EOS (ABSOLUTE): 0.1 10*3/uL (ref 0.0–0.4)
Eos: 1 %
Hematocrit: 37.7 % (ref 34.0–46.6)
Hemoglobin: 12.8 g/dL (ref 11.1–15.9)
Immature Grans (Abs): 0 10*3/uL (ref 0.0–0.1)
Immature Granulocytes: 0 %
Lymphocytes Absolute: 2.2 10*3/uL (ref 0.7–3.1)
Lymphs: 30 %
MCH: 30.5 pg (ref 26.6–33.0)
MCHC: 34 g/dL (ref 31.5–35.7)
MCV: 90 fL (ref 79–97)
Monocytes Absolute: 0.7 10*3/uL (ref 0.1–0.9)
Monocytes: 10 %
Neutrophils Absolute: 4.2 10*3/uL (ref 1.4–7.0)
Neutrophils: 58 %
Platelets: 262 10*3/uL (ref 150–450)
RBC: 4.2 x10E6/uL (ref 3.77–5.28)
RDW: 11.9 % (ref 11.7–15.4)
WBC: 7.3 10*3/uL (ref 3.4–10.8)

## 2019-08-02 LAB — COMPREHENSIVE METABOLIC PANEL
ALT: 10 IU/L (ref 0–32)
AST: 19 IU/L (ref 0–40)
Albumin/Globulin Ratio: 1.4 (ref 1.2–2.2)
Albumin: 4.3 g/dL (ref 3.8–4.8)
Alkaline Phosphatase: 59 IU/L (ref 39–117)
BUN/Creatinine Ratio: 13 (ref 9–23)
BUN: 9 mg/dL (ref 6–20)
Bilirubin Total: 1.3 mg/dL — ABNORMAL HIGH (ref 0.0–1.2)
CO2: 25 mmol/L (ref 20–29)
Calcium: 9.7 mg/dL (ref 8.7–10.2)
Chloride: 102 mmol/L (ref 96–106)
Creatinine, Ser: 0.69 mg/dL (ref 0.57–1.00)
GFR calc Af Amer: 132 mL/min/{1.73_m2} (ref >59)
GFR calc non Af Amer: 115 mL/min/{1.73_m2} (ref >59)
Globulin, Total: 3 g/dL (ref 1.5–4.5)
Glucose: 95 mg/dL (ref 65–99)
Potassium: 4.3 mmol/L (ref 3.5–5.2)
Sodium: 139 mmol/L (ref 134–144)
Total Protein: 7.3 g/dL (ref 6.0–8.5)

## 2019-08-02 LAB — T3: T3, Total: 112 ng/dL (ref 71–180)

## 2019-08-02 LAB — TSH: TSH: 2.93 u[IU]/mL (ref 0.450–4.500)

## 2019-08-02 LAB — T4, FREE: Free T4: 1.12 ng/dL (ref 0.82–1.77)

## 2019-08-24 MED FILL — BD 3 ML SYRINGE WITH NEEDLE: 21G X 1" | 20 days supply | Qty: 20 | Fill #0

## 2019-08-24 MED FILL — MENOPUR 75 UNIT VIAL: 75 | 1 days supply | Qty: 5 | Fill #0

## 2019-08-24 MED FILL — GONAL-F RFF REDI-JECT 450 U: 450 | 5 days supply | Qty: 2 | Fill #0

## 2019-08-24 MED FILL — CRINONE 8 % GEL: 8 | 30 days supply | Qty: 34 | Fill #0

## 2019-08-24 MED FILL — BD NEEDLES 27GX0.5: 27G X 1/2" | 20 days supply | Qty: 20 | Fill #0

## 2019-08-24 MED FILL — BD NEEDLES 27GX0.5": 27G X 1/2" | 20 days supply | Qty: 20 | Fill #0

## 2019-08-26 DIAGNOSIS — Z01419 Encounter for gynecological examination (general) (routine) without abnormal findings: Secondary | ICD-10-CM | POA: Diagnosis not present

## 2019-09-01 MED FILL — PREGNYL 10,000 UNITS VIAL: 10000 | 1 days supply | Qty: 1 | Fill #0

## 2019-09-02 DIAGNOSIS — N970 Female infertility associated with anovulation: Secondary | ICD-10-CM | POA: Diagnosis not present

## 2019-09-12 MED FILL — PREGNYL 10,000 UNITS VIAL: 10000 | 1 days supply | Qty: 1 | Fill #0

## 2019-09-14 DIAGNOSIS — Z3183 Encounter for assisted reproductive fertility procedure cycle: Secondary | ICD-10-CM | POA: Diagnosis not present

## 2019-09-19 MED FILL — MENOPUR 75 UNIT VIAL: 75 | 1 days supply | Qty: 5 | Fill #1

## 2019-09-20 DIAGNOSIS — Z3183 Encounter for assisted reproductive fertility procedure cycle: Secondary | ICD-10-CM | POA: Diagnosis not present

## 2019-09-20 MED FILL — GONAL-F RFF REDI-JECT 450 U: 450 | 5 days supply | Qty: 2 | Fill #1

## 2019-09-23 DIAGNOSIS — Z3183 Encounter for assisted reproductive fertility procedure cycle: Secondary | ICD-10-CM | POA: Diagnosis not present

## 2019-09-23 MED FILL — MENOPUR 75 UNIT VIAL: 75 | 1 days supply | Qty: 5 | Fill #2

## 2019-09-23 MED FILL — GONAL-F RFF REDI-JECT 450 U: 450 | 1 days supply | Qty: 1 | Fill #0

## 2019-09-23 MED FILL — GONAL-F RFF REDI-JECT 900 U: 900 | 2 days supply | Qty: 2 | Fill #0

## 2019-09-25 DIAGNOSIS — Z3183 Encounter for assisted reproductive fertility procedure cycle: Secondary | ICD-10-CM | POA: Diagnosis not present

## 2019-09-26 DIAGNOSIS — Z3183 Encounter for assisted reproductive fertility procedure cycle: Secondary | ICD-10-CM | POA: Diagnosis not present

## 2019-09-28 DIAGNOSIS — Z3183 Encounter for assisted reproductive fertility procedure cycle: Secondary | ICD-10-CM | POA: Diagnosis not present

## 2019-09-28 DIAGNOSIS — N979 Female infertility, unspecified: Secondary | ICD-10-CM | POA: Diagnosis not present

## 2019-10-19 DIAGNOSIS — H5213 Myopia, bilateral: Secondary | ICD-10-CM | POA: Diagnosis not present

## 2019-10-19 DIAGNOSIS — H52203 Unspecified astigmatism, bilateral: Secondary | ICD-10-CM | POA: Diagnosis not present

## 2019-11-07 MED FILL — CRINONE 8 % GEL: 8 | 30 days supply | Qty: 34 | Fill #0

## 2019-11-07 MED FILL — GONAL-F RFF REDI-JECT 450 U: 450 | 2 days supply | Qty: 1 | Fill #0

## 2019-11-07 MED FILL — CHORIONIC GONAD 10,000 UNIT: 10000 | 30 days supply | Qty: 1 | Fill #0

## 2019-11-07 MED FILL — GONAL-F RFF REDI-JECT 900 U: 900 | 2 days supply | Qty: 2 | Fill #0

## 2019-11-07 MED FILL — MENOPUR 75 UNIT VIAL: 75 | 1 days supply | Qty: 5 | Fill #0

## 2019-11-07 MED FILL — BD NEEDLES 27GX0.5: 27G X 1/2" | 30 days supply | Qty: 20 | Fill #0

## 2019-11-07 MED FILL — BD 3 ML SYRINGE WITH NEEDLE: 21G X 1" | 30 days supply | Qty: 20 | Fill #0

## 2019-11-15 MED FILL — MENOPUR 75 UNIT VIAL: 75 | 1 days supply | Qty: 5 | Fill #3

## 2019-11-15 MED FILL — GONAL-F RFF REDI-JECT 900 U: 900 | 2 days supply | Qty: 2 | Fill #1

## 2019-11-17 DIAGNOSIS — Z3183 Encounter for assisted reproductive fertility procedure cycle: Secondary | ICD-10-CM | POA: Diagnosis not present

## 2019-11-23 DIAGNOSIS — Z3183 Encounter for assisted reproductive fertility procedure cycle: Secondary | ICD-10-CM | POA: Diagnosis not present

## 2019-11-23 MED FILL — GONAL-F RFF REDI-JECT 900 U: 900 | 2 days supply | Qty: 2 | Fill #1

## 2019-11-26 DIAGNOSIS — Z3183 Encounter for assisted reproductive fertility procedure cycle: Secondary | ICD-10-CM | POA: Diagnosis not present

## 2019-11-27 DIAGNOSIS — Z3183 Encounter for assisted reproductive fertility procedure cycle: Secondary | ICD-10-CM | POA: Diagnosis not present

## 2019-11-29 DIAGNOSIS — N979 Female infertility, unspecified: Secondary | ICD-10-CM | POA: Diagnosis not present

## 2019-11-29 DIAGNOSIS — Z3183 Encounter for assisted reproductive fertility procedure cycle: Secondary | ICD-10-CM | POA: Diagnosis not present

## 2019-12-14 ENCOUNTER — Other Ambulatory Visit (HOSPITAL_COMMUNITY): Payer: Self-pay | Admitting: Obstetrics and Gynecology

## 2019-12-20 MED FILL — ESTRADIOL 2 MG TABS: 2 | 30 days supply | Qty: 90 | Fill #0

## 2019-12-20 MED FILL — PROGESTERONE OIL 50 MG/ML V: 50 | 20 days supply | Qty: 20 | Fill #0

## 2019-12-20 MED FILL — BD 3 ML SYRINGE 18GX1-1/2: 18G X 1-1/2 | 20 days supply | Qty: 20 | Fill #0

## 2019-12-20 MED FILL — BD NEEDLES 22GX1.5: 22G X 1-1/2 | 20 days supply | Qty: 20 | Fill #0

## 2019-12-27 DIAGNOSIS — Z3183 Encounter for assisted reproductive fertility procedure cycle: Secondary | ICD-10-CM | POA: Diagnosis not present

## 2020-01-01 ENCOUNTER — Encounter: Payer: Self-pay | Admitting: Family Medicine

## 2020-01-05 DIAGNOSIS — Z3183 Encounter for assisted reproductive fertility procedure cycle: Secondary | ICD-10-CM | POA: Diagnosis not present

## 2020-01-11 DIAGNOSIS — Z3183 Encounter for assisted reproductive fertility procedure cycle: Secondary | ICD-10-CM | POA: Diagnosis not present

## 2020-01-19 DIAGNOSIS — Z3183 Encounter for assisted reproductive fertility procedure cycle: Secondary | ICD-10-CM | POA: Diagnosis not present

## 2020-01-24 DIAGNOSIS — Z3183 Encounter for assisted reproductive fertility procedure cycle: Secondary | ICD-10-CM | POA: Diagnosis not present

## 2020-01-24 MED FILL — ESTRADIOL 2 MG TABS: 2 | 30 days supply | Qty: 90 | Fill #1

## 2020-01-26 MED FILL — BD 3 ML SYRINGE 18GX1-1/2: 18G X 1-1/2 | 20 days supply | Qty: 20 | Fill #1

## 2020-01-26 MED FILL — BD NEEDLES 22GX1.5: 22G X 1-1/2 | 20 days supply | Qty: 20 | Fill #1

## 2020-01-27 ENCOUNTER — Ambulatory Visit: Payer: 59 | Admitting: Family Medicine

## 2020-02-02 DIAGNOSIS — Z3200 Encounter for pregnancy test, result unknown: Secondary | ICD-10-CM | POA: Diagnosis not present

## 2020-03-07 DIAGNOSIS — Z139 Encounter for screening, unspecified: Secondary | ICD-10-CM | POA: Diagnosis not present

## 2020-03-07 DIAGNOSIS — N979 Female infertility, unspecified: Secondary | ICD-10-CM | POA: Diagnosis not present

## 2020-03-07 DIAGNOSIS — E289 Ovarian dysfunction, unspecified: Secondary | ICD-10-CM | POA: Diagnosis not present

## 2020-03-07 DIAGNOSIS — Z3183 Encounter for assisted reproductive fertility procedure cycle: Secondary | ICD-10-CM | POA: Diagnosis not present

## 2020-03-08 MED FILL — BD NEEDLES 22GX1.5: 22G X 1-1/2 | 20 days supply | Qty: 20 | Fill #2

## 2020-03-08 MED FILL — ESTRADIOL 2 MG TABS: 2 | 30 days supply | Qty: 90 | Fill #2

## 2020-03-08 MED FILL — BD 3 ML SYRINGE 18GX1-1/2: 18G X 1-1/2 | 20 days supply | Qty: 20 | Fill #2

## 2020-03-08 MED FILL — PROGESTERONE OIL 50 MG/ML V: 50 | 20 days supply | Qty: 20 | Fill #1

## 2020-03-13 DIAGNOSIS — Z3183 Encounter for assisted reproductive fertility procedure cycle: Secondary | ICD-10-CM | POA: Diagnosis not present

## 2020-03-22 DIAGNOSIS — Z3183 Encounter for assisted reproductive fertility procedure cycle: Secondary | ICD-10-CM | POA: Diagnosis not present

## 2020-03-27 DIAGNOSIS — Z20828 Contact with and (suspected) exposure to other viral communicable diseases: Secondary | ICD-10-CM | POA: Diagnosis not present

## 2020-03-28 DIAGNOSIS — Z3183 Encounter for assisted reproductive fertility procedure cycle: Secondary | ICD-10-CM | POA: Diagnosis not present

## 2020-04-06 DIAGNOSIS — Z3200 Encounter for pregnancy test, result unknown: Secondary | ICD-10-CM | POA: Diagnosis not present

## 2020-04-06 MED FILL — PROGESTERONE OIL 50 MG/ML V: 50 | 20 days supply | Qty: 20 | Fill #2

## 2020-04-06 MED FILL — BD NEEDLES 22GX1.5: 22G X 1-1/2 | 20 days supply | Qty: 20 | Fill #3

## 2020-04-06 MED FILL — BD 3 ML SYRINGE 18GX1-1/2: 18G X 1-1/2 | 20 days supply | Qty: 20 | Fill #3

## 2020-04-10 DIAGNOSIS — Z3201 Encounter for pregnancy test, result positive: Secondary | ICD-10-CM | POA: Diagnosis not present

## 2020-04-17 MED FILL — BD 3 ML SYRINGE 18GX1-1/2: 18G X 1-1/2 | 20 days supply | Qty: 20 | Fill #4

## 2020-04-17 MED FILL — ESTRADIOL 2 MG TABS: 2 | 30 days supply | Qty: 90 | Fill #3

## 2020-04-17 MED FILL — BD NEEDLES 22GX1.5: 22G X 1-1/2 | 20 days supply | Qty: 20 | Fill #4

## 2020-04-23 MED FILL — PROGESTERONE OIL 50 MG/ML V: 50 | 20 days supply | Qty: 20 | Fill #3

## 2020-04-25 DIAGNOSIS — O09819 Supervision of pregnancy resulting from assisted reproductive technology, unspecified trimester: Secondary | ICD-10-CM | POA: Diagnosis not present

## 2020-05-04 DIAGNOSIS — O09819 Supervision of pregnancy resulting from assisted reproductive technology, unspecified trimester: Secondary | ICD-10-CM | POA: Diagnosis not present

## 2020-05-24 DIAGNOSIS — Z113 Encounter for screening for infections with a predominantly sexual mode of transmission: Secondary | ICD-10-CM | POA: Diagnosis not present

## 2020-05-24 DIAGNOSIS — Z3481 Encounter for supervision of other normal pregnancy, first trimester: Secondary | ICD-10-CM | POA: Diagnosis not present

## 2020-05-24 DIAGNOSIS — Z3482 Encounter for supervision of other normal pregnancy, second trimester: Secondary | ICD-10-CM | POA: Diagnosis not present

## 2020-05-24 DIAGNOSIS — O26841 Uterine size-date discrepancy, first trimester: Secondary | ICD-10-CM | POA: Diagnosis not present

## 2020-05-24 LAB — OB RESULTS CONSOLE HIV ANTIBODY (ROUTINE TESTING): HIV: NONREACTIVE

## 2020-05-24 LAB — HEPATITIS C ANTIBODY: HCV Ab: NEGATIVE

## 2020-05-24 LAB — OB RESULTS CONSOLE RUBELLA ANTIBODY, IGM: Rubella: IMMUNE

## 2020-05-24 LAB — OB RESULTS CONSOLE GC/CHLAMYDIA
Chlamydia: NEGATIVE
Gonorrhea: NEGATIVE

## 2020-05-24 LAB — OB RESULTS CONSOLE HEPATITIS B SURFACE ANTIGEN: Hepatitis B Surface Ag: NEGATIVE

## 2020-06-01 ENCOUNTER — Encounter: Payer: Self-pay | Admitting: Internal Medicine

## 2020-06-05 DIAGNOSIS — Z3480 Encounter for supervision of other normal pregnancy, unspecified trimester: Secondary | ICD-10-CM | POA: Diagnosis not present

## 2020-06-05 DIAGNOSIS — Z369 Encounter for antenatal screening, unspecified: Secondary | ICD-10-CM | POA: Diagnosis not present

## 2020-06-08 ENCOUNTER — Encounter: Payer: Self-pay | Admitting: Family Medicine

## 2020-06-23 NOTE — L&D Delivery Note (Signed)
Delivery Note Kimberly Gomez is a [id number] at [redacted]w[redacted]d who had a spontaneous delivery at 15:19 on 12/06/20 a viable female was delivered via LOA.  APGAR: 6, 8; weight 8 lb 9.2 oz (3890 g)  [name] was admitted for spontaneous rupture membranes with labor. She was 3.5cm and progressed normally. She received an epidural for pain management. Pushed for 26 minutes. Head delivered spontaneously, restituted maternal right, shoulder dystocia was then called, suprapubic pressure followed by McRoberts, and then posterior arm was delivered. One minute shoulder dystocia. No nuchal cord. Baby placed on maternal abdomen. Delayed cord clamping for 60 seconds. Delivery of placenta was spontaneous. Placenta was found to be intact, noted to be a succenturiate placenta, 3 -vessel cord was noted. The fundus was found to be firm and the lower uterine segmant was cleared of clot x2. 2nd degree perineal laceration was repaired in the normal sterile fashion with 2-0 vicryl rapide. DRE with good rectal tone and no sutures. Estimated blood loss 700cc. Instrument and gauze counts were correct at the end of the procedure. After delivery baby was examined and moving all extremities.   Placenta status: to pathology Mom to postpartum.  Baby to Couplet care / Skin to Skin.  Sherilyn Windhorst K Taam-Akelman 12/06/2020, 4:02 PM

## 2020-06-26 DIAGNOSIS — E039 Hypothyroidism, unspecified: Secondary | ICD-10-CM | POA: Diagnosis not present

## 2020-06-26 DIAGNOSIS — Z3482 Encounter for supervision of other normal pregnancy, second trimester: Secondary | ICD-10-CM | POA: Diagnosis not present

## 2020-06-26 DIAGNOSIS — Z369 Encounter for antenatal screening, unspecified: Secondary | ICD-10-CM | POA: Diagnosis not present

## 2020-06-28 ENCOUNTER — Other Ambulatory Visit: Payer: Self-pay | Admitting: Obstetrics and Gynecology

## 2020-06-28 DIAGNOSIS — Z363 Encounter for antenatal screening for malformations: Secondary | ICD-10-CM

## 2020-07-20 ENCOUNTER — Encounter: Payer: Self-pay | Admitting: *Deleted

## 2020-07-24 ENCOUNTER — Other Ambulatory Visit: Payer: Self-pay

## 2020-07-24 ENCOUNTER — Ambulatory Visit: Payer: 59 | Attending: Obstetrics and Gynecology

## 2020-07-24 ENCOUNTER — Encounter: Payer: Self-pay | Admitting: *Deleted

## 2020-07-24 ENCOUNTER — Ambulatory Visit: Payer: 59 | Admitting: *Deleted

## 2020-07-24 VITALS — BP 136/80 | HR 95

## 2020-07-24 DIAGNOSIS — Z363 Encounter for antenatal screening for malformations: Secondary | ICD-10-CM | POA: Diagnosis not present

## 2020-07-24 DIAGNOSIS — O09212 Supervision of pregnancy with history of pre-term labor, second trimester: Secondary | ICD-10-CM

## 2020-07-24 DIAGNOSIS — O09812 Supervision of pregnancy resulting from assisted reproductive technology, second trimester: Secondary | ICD-10-CM

## 2020-07-24 DIAGNOSIS — Z3A19 19 weeks gestation of pregnancy: Secondary | ICD-10-CM | POA: Diagnosis not present

## 2020-07-24 DIAGNOSIS — E079 Disorder of thyroid, unspecified: Secondary | ICD-10-CM | POA: Diagnosis not present

## 2020-07-24 DIAGNOSIS — O99282 Endocrine, nutritional and metabolic diseases complicating pregnancy, second trimester: Secondary | ICD-10-CM

## 2020-07-24 DIAGNOSIS — Z8759 Personal history of other complications of pregnancy, childbirth and the puerperium: Secondary | ICD-10-CM | POA: Diagnosis not present

## 2020-07-24 DIAGNOSIS — Z8751 Personal history of pre-term labor: Secondary | ICD-10-CM | POA: Diagnosis not present

## 2020-07-24 DIAGNOSIS — O09292 Supervision of pregnancy with other poor reproductive or obstetric history, second trimester: Secondary | ICD-10-CM | POA: Diagnosis not present

## 2020-07-24 DIAGNOSIS — Z369 Encounter for antenatal screening, unspecified: Secondary | ICD-10-CM | POA: Diagnosis not present

## 2020-07-24 DIAGNOSIS — O321XX0 Maternal care for breech presentation, not applicable or unspecified: Secondary | ICD-10-CM

## 2020-07-24 IMAGING — US US MFM OB DETAIL+14 WK
1 series · 13 of 28 positions shown · non-contrast
Comparison: none

[Series 1: us mfm ob detail+14 wk · 142 acquisitions, 13 frames shown]
[im 6/142]
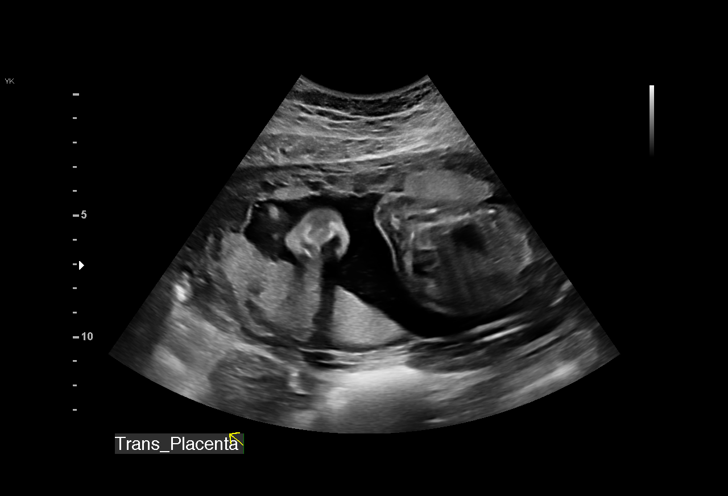
[im 16/142]
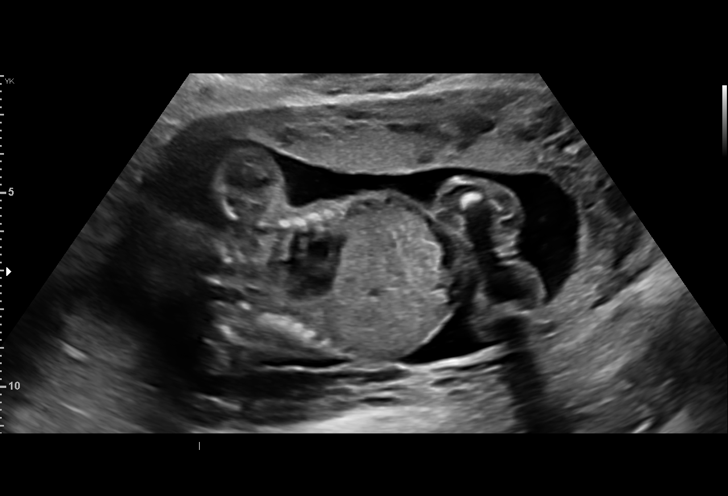
[im 27/142]
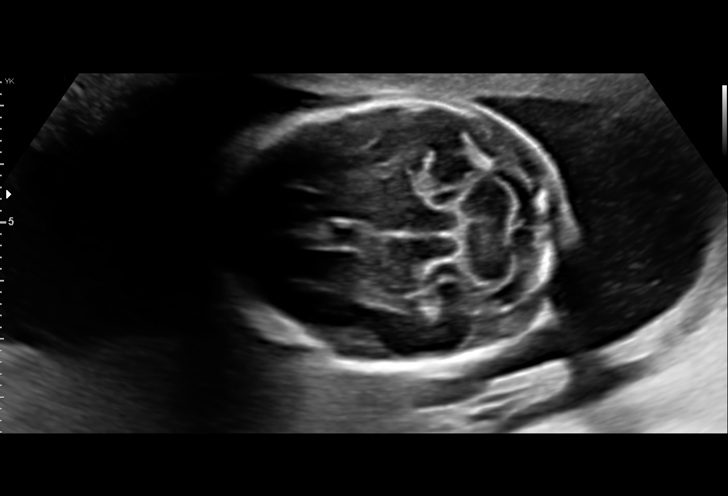
[im 37/142]
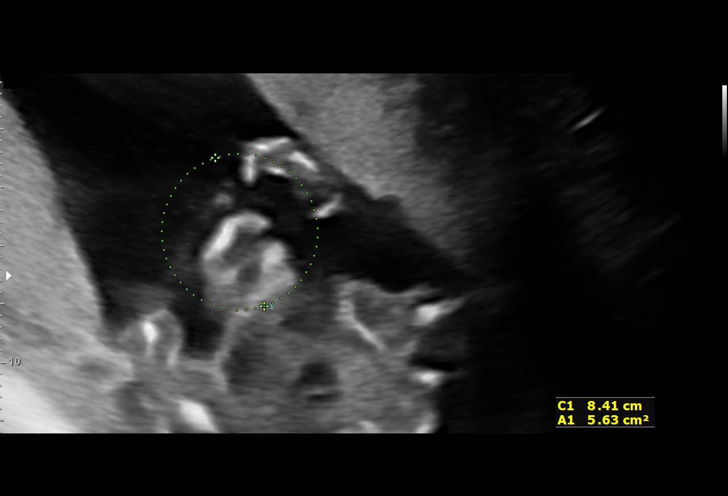
[im 48/142]
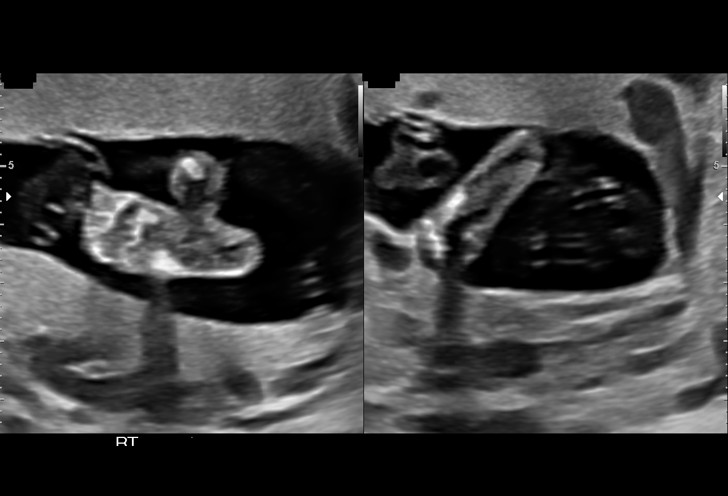
[im 58/142]
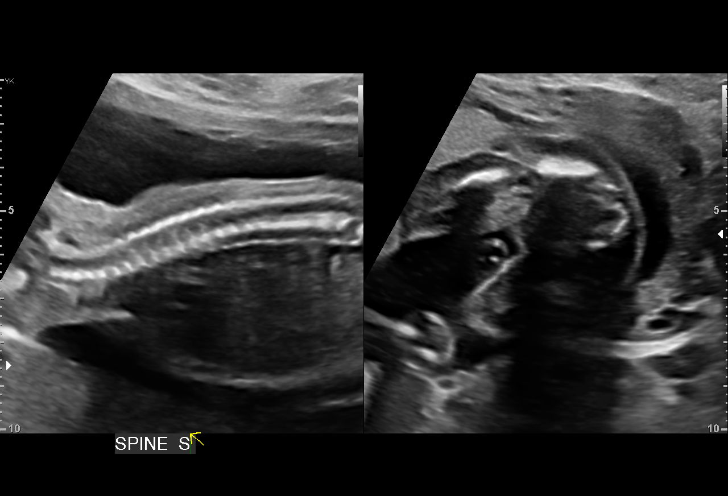
[im 74/142]
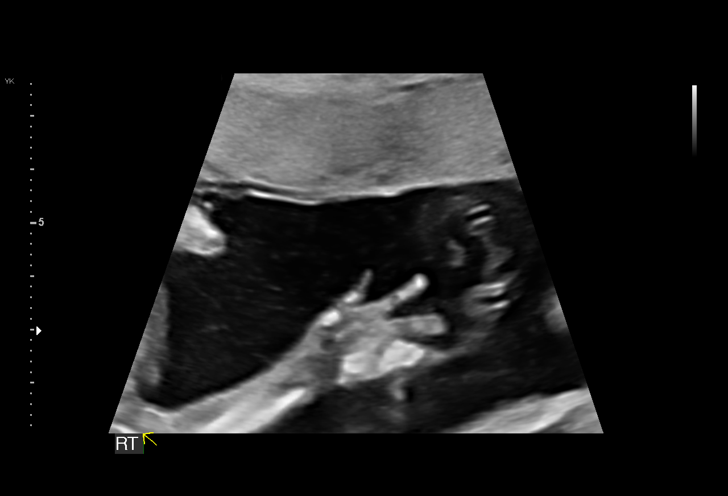
[im 84/142]
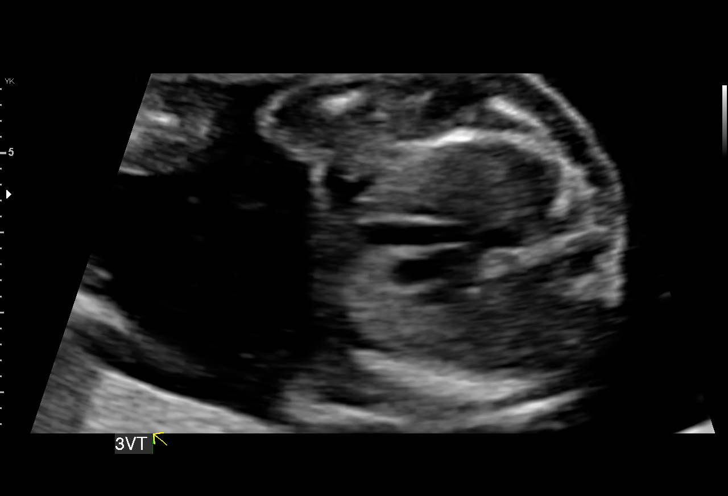
[im 95/142]
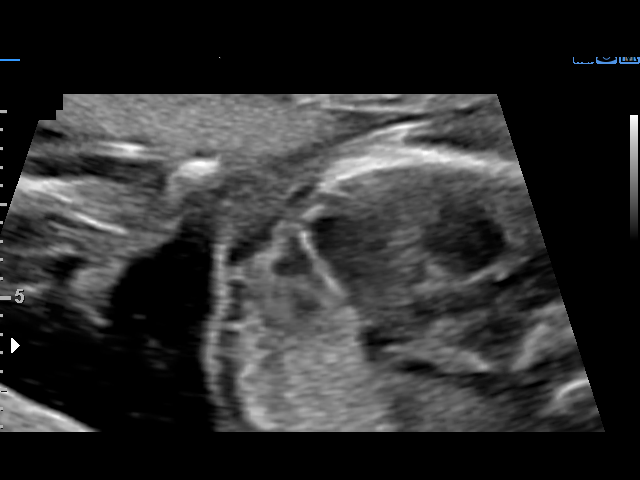
[im 105/142]
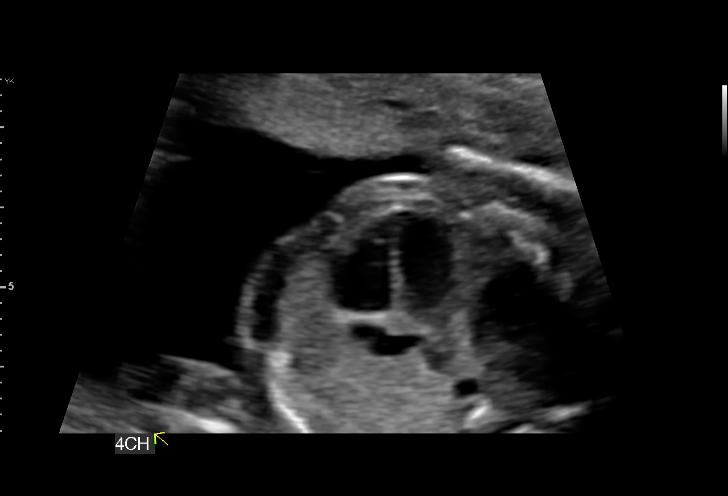
[im 115/142]
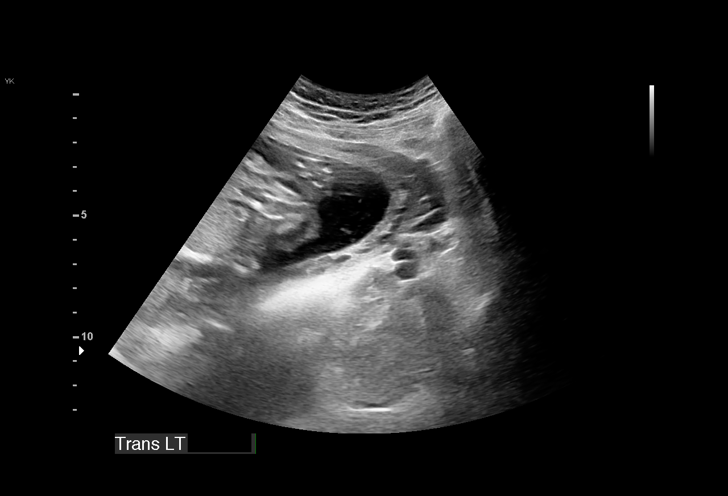
[im 126/142]
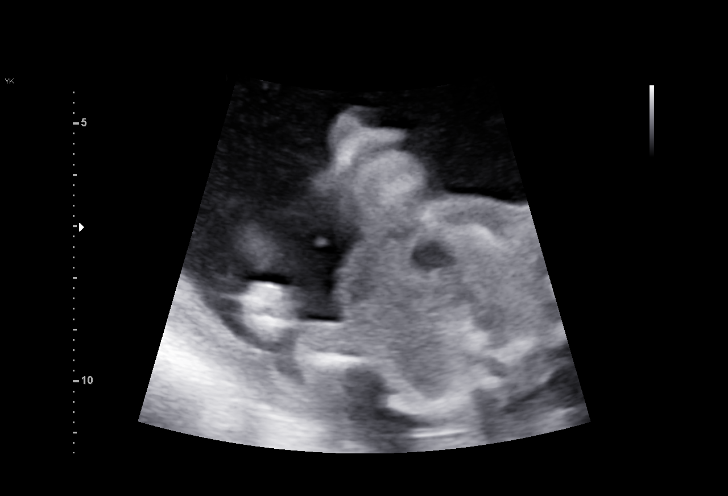
[im 136/142]
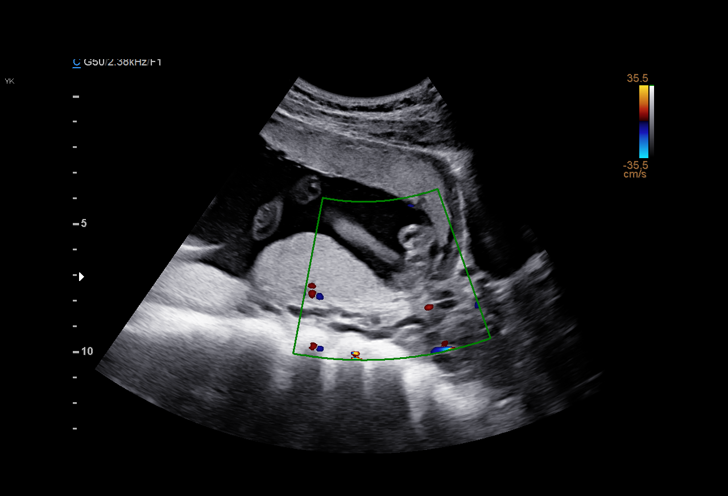

[13 of 28 positions shown; findings below may reference images not displayed]

Indications

 Pregnancy resulting from assisted              [SB]
 reproductive technology
 Poor obstetric history: Previous               [SB]
 preeclampsia / eclampsia/gestational HTN
 (ASA)
 Declined genetic testing
 Poor obstetric history: Previous preterm       [SB]
 delivery, antepartum (induced due to
 preeclampsia)
 Thyroid disease in pregnancy (on               O99.280, [SB]
 Levothyroine)
 Encounter for antenatal screening for          [SB]
 malformations
 19 weeks gestation of pregnancy
Fetal Evaluation

 Num Of Fetuses:         1
 Fetal Heart Rate(bpm):  150
 Cardiac Activity:       Observed
 Presentation:           Breech
 Placenta:               Right lateral
 P. Cord Insertion:      Visualized, central

 Amniotic Fluid
 AFI FV:      Within normal limits

                             Largest Pocket(cm)

Biometry

 BPD:      46.1  mm     G. Age:  20w 0d         53  %    CI:        73.46   %    70 - 86
                                                         FL/HC:      18.3   %    16.8 -
 HC:      170.9  mm     G. Age:  19w 5d         34  %    HC/AC:      1.09        1.09 -
 AC:      156.5  mm     G. Age:  20w 6d         75  %    FL/BPD:     67.9   %
 FL:       31.3  mm     G. Age:  19w 5d         37  %    FL/AC:      20.0   %    20 - 24
 CER:      20.8  mm     G. Age:  19w 6d         69  %
 NFT:       3.7  mm

 CM:        4.2  mm

 Est. FW:     340  gm    0 lb 12 oz      67  %
OB History

 Gravidity:    3         Term:   1         SAB:   1
 Living:       1
Gestational Age

 LMP:           19w 6d        Date:  [DATE]                 EDD:   [DATE]
 U/S Today:     20w 1d                                        EDD:   [DATE]
 Best:          19w 6d     Det. By:  LMP  ([DATE])          EDD:   [DATE]
Anatomy

 Cranium:               Appears normal         Aortic Arch:            Appears normal
 Cavum:                 Appears normal         Ductal Arch:            Appears normal
 Ventricles:            Appears normal         Diaphragm:              Appears normal
 Choroid Plexus:        Appears normal         Stomach:                Appears normal, left
                                                                       sided
 Cerebellum:            Appears normal         Abdomen:                Appears normal
 Posterior Fossa:       Appears normal         Abdominal Wall:         Appears nml (cord
                                                                       insert, abd wall)
 Nuchal Fold:           Appears normal         Cord Vessels:           Appears normal (3
                                                                       vessel cord)
 Face:                  Appears normal         Kidneys:                Appear normal
                        (orbits and profile)
 Lips:                  Appears normal         Bladder:                Appears normal
 Thoracic:              Appears normal         Spine:                  Appears normal
 Heart:                 Appears normal         Upper Extremities:      Appears normal
                        (4CH, axis, and
                        situs)
 RVOT:                  Appears normal         Lower Extremities:      Appears normal
 LVOT:                  Appears normal

 Other:  Parents do not wish to know sex of fetus.Hands and feet visualized.
         Heels appear normal. Lenses visualized. Nasal bone visualized.
Comments

 This patient was seen for a detailed fetal anatomy scan due
 to an IVF pregnancy.  She reports a history of severe
 preeclampsia requiring an indicated preterm birth at 33+
 weeks during her last pregnancy.  She is taking a daily baby
 aspirin for preeclampsia prophylaxis.
 She denies any other significant past medical history and
 denies any problems in her current pregnancy.
 She reports that she had a first trimester nuchal translucency
 screening test that indicated a low risk for Down syndrome.
 She was informed that the fetal growth and amniotic fluid
 level were appropriate for her gestational age.
 There were no obvious fetal anomalies noted on today's
 ultrasound exam.
 The patient was informed that anomalies may be missed due
 to technical limitations. If the fetus is in a suboptimal position
 or maternal habitus is increased, visualization of the fetus in
 the maternal uterus may be impaired.
 Due to the IVF pregnancy, she was referred to [HOSPITAL] pediatric
 cardiology for a fetal echocardiogram.
 No further exams were scheduled in our office.

## 2020-08-15 DIAGNOSIS — O09812 Supervision of pregnancy resulting from assisted reproductive technology, second trimester: Secondary | ICD-10-CM | POA: Diagnosis not present

## 2020-08-20 DIAGNOSIS — Z369 Encounter for antenatal screening, unspecified: Secondary | ICD-10-CM | POA: Diagnosis not present

## 2020-08-22 ENCOUNTER — Telehealth: Payer: Self-pay

## 2020-08-22 NOTE — Telephone Encounter (Signed)
Pt was called to advisee that she need to schedule her annual with Vickie. [place]

## 2020-09-17 DIAGNOSIS — Z23 Encounter for immunization: Secondary | ICD-10-CM | POA: Diagnosis not present

## 2020-09-17 DIAGNOSIS — Z3480 Encounter for supervision of other normal pregnancy, unspecified trimester: Secondary | ICD-10-CM | POA: Diagnosis not present

## 2020-09-17 DIAGNOSIS — E039 Hypothyroidism, unspecified: Secondary | ICD-10-CM | POA: Diagnosis not present

## 2020-09-17 LAB — OB RESULTS CONSOLE RPR: RPR: NONREACTIVE

## 2020-09-21 ENCOUNTER — Encounter (HOSPITAL_COMMUNITY): Payer: Self-pay | Admitting: Obstetrics and Gynecology

## 2020-09-21 ENCOUNTER — Other Ambulatory Visit: Payer: Self-pay

## 2020-09-21 ENCOUNTER — Encounter: Payer: Self-pay | Admitting: Family Medicine

## 2020-09-21 ENCOUNTER — Inpatient Hospital Stay (HOSPITAL_COMMUNITY)
Admission: AD | Admit: 2020-09-21 | Discharge: 2020-09-21 | Disposition: A | Discharge status: Home / Self Care | Payer: 59 | Attending: Obstetrics and Gynecology | Admitting: Obstetrics and Gynecology

## 2020-09-21 DIAGNOSIS — Z79899 Other long term (current) drug therapy: Secondary | ICD-10-CM | POA: Insufficient documentation

## 2020-09-21 DIAGNOSIS — R030 Elevated blood-pressure reading, without diagnosis of hypertension: Secondary | ICD-10-CM | POA: Insufficient documentation

## 2020-09-21 DIAGNOSIS — O09213 Supervision of pregnancy with history of pre-term labor, third trimester: Secondary | ICD-10-CM

## 2020-09-21 DIAGNOSIS — O26893 Other specified pregnancy related conditions, third trimester: Secondary | ICD-10-CM | POA: Insufficient documentation

## 2020-09-21 DIAGNOSIS — Z7982 Long term (current) use of aspirin: Secondary | ICD-10-CM | POA: Insufficient documentation

## 2020-09-21 DIAGNOSIS — Z0371 Encounter for suspected problem with amniotic cavity and membrane ruled out: Secondary | ICD-10-CM | POA: Diagnosis not present

## 2020-09-21 DIAGNOSIS — Z3A28 28 weeks gestation of pregnancy: Secondary | ICD-10-CM | POA: Diagnosis not present

## 2020-09-21 LAB — COMPREHENSIVE METABOLIC PANEL
ALT: 20 U/L (ref 0–44)
AST: 26 U/L (ref 15–41)
Albumin: 3 g/dL — ABNORMAL LOW (ref 3.5–5.0)
Alkaline Phosphatase: 76 U/L (ref 38–126)
Anion gap: 7 (ref 5–15)
BUN: 6 mg/dL (ref 6–20)
CO2: 23 mmol/L (ref 22–32)
Calcium: 8.5 mg/dL — ABNORMAL LOW (ref 8.9–10.3)
Chloride: 106 mmol/L (ref 98–111)
Creatinine, Ser: 0.66 mg/dL (ref 0.44–1.00)
GFR, Estimated: >60 mL/min (ref >60)
Glucose, Bld: 88 mg/dL (ref 70–99)
Potassium: 3.5 mmol/L (ref 3.5–5.1)
Sodium: 136 mmol/L (ref 135–145)
Total Bilirubin: 1.5 mg/dL — ABNORMAL HIGH (ref 0.3–1.2)
Total Protein: 6.7 g/dL (ref 6.5–8.1)

## 2020-09-21 LAB — WET PREP, GENITAL
Clue Cells Wet Prep HPF POC: NONE SEEN
Sperm: NONE SEEN
Trich, Wet Prep: NONE SEEN
Yeast Wet Prep HPF POC: NONE SEEN

## 2020-09-21 LAB — URINALYSIS, ROUTINE W REFLEX MICROSCOPIC
Bilirubin Urine: NEGATIVE
Glucose, UA: NEGATIVE mg/dL
Hgb urine dipstick: NEGATIVE
Ketones, ur: 20 mg/dL — AB
Leukocytes,Ua: NEGATIVE
Nitrite: NEGATIVE
Protein, ur: NEGATIVE mg/dL
Specific Gravity, Urine: 1.015 (ref 1.005–1.030)
pH: 7 (ref 5.0–8.0)

## 2020-09-21 LAB — PROTEIN / CREATININE RATIO, URINE
Creatinine, Urine: 109.98 mg/dL
Protein Creatinine Ratio: 0.12 mg/mg{Cre} (ref 0.00–0.15)
Total Protein, Urine: 13 mg/dL

## 2020-09-21 LAB — CBC
HCT: 34.5 % — ABNORMAL LOW (ref 36.0–46.0)
Hemoglobin: 12.1 g/dL (ref 12.0–15.0)
MCH: 32.8 pg (ref 26.0–34.0)
MCHC: 35.1 g/dL (ref 30.0–36.0)
MCV: 93.5 fL (ref 80.0–100.0)
Platelets: 235 10*3/uL (ref 150–400)
RBC: 3.69 MIL/uL — ABNORMAL LOW (ref 3.87–5.11)
RDW: 13 % (ref 11.5–15.5)
WBC: 11.8 10*3/uL — ABNORMAL HIGH (ref 4.0–10.5)
nRBC: 0 % (ref 0.0–0.2)

## 2020-09-21 LAB — AMNISURE RUPTURE OF MEMBRANE (ROM) NOT AT ARMC: Amnisure ROM: NEGATIVE

## 2020-09-21 NOTE — Discharge Instructions (Signed)
Hypertension During Pregnancy High blood pressure (hypertension) is when the force of blood pumping through the arteries is high enough to cause problems with your health. Arteries are blood vessels that carry blood from the heart throughout the body. Hypertension during pregnancy can cause problems for you and your baby. It can be mild or severe. There are different types of hypertension that can happen during pregnancy. These include:  Chronic hypertension. This happens when you had high blood pressure before you became pregnant, and it continues during the pregnancy. Hypertension that develops before you are [redacted] weeks pregnant and continues during the pregnancy is also called chronic hypertension. If you have chronic hypertension, it will not go away after you have your baby. You will need follow-up visits with your health care provider after you have your baby. Your health care provider may want you to keep taking medicine for your blood pressure.  Gestational hypertension. This is hypertension that develops after the 20th week of pregnancy. Gestational hypertension usually goes away after you have your baby, but your health care provider will need to monitor your blood pressure to make sure that it is getting better.  Postpartum hypertension. This is high blood pressure that was present before delivery and continues after delivery or that starts after delivery. This usually occurs within 48 hours after childbirth but may occur up to 6 weeks after giving birth. When hypertension during pregnancy is severe, it is a medical emergency that requires treatment right away. How does this affect me? Women who have hypertension during pregnancy have a greater chance of developing hypertension later in life or during future pregnancies. In some cases, hypertension during pregnancy can cause serious complications, such as:  Stroke.  Heart attack.  Injury to other organs, such as kidneys, lungs, or  liver.  Preeclampsia.  A condition called hemolysis, elevated liver enzymes, and low platelet count (HELLP) syndrome.  Convulsions or seizures.  Placental abruption. How does this affect my baby? Hypertension during pregnancy can affect your baby. Your baby may:  Be born early (prematurely).  Not weigh as much as he or she should at birth (low birth weight).  Not tolerate labor well, leading to an unplanned cesarean delivery. This condition may also result in a baby's death before birth (stillbirth). What are the risks? There are certain factors that make it more likely for you to develop hypertension during pregnancy. These include:  Having hypertension during a previous pregnancy or a family history of hypertension.  Being overweight.  Being age 35 or older.  Being pregnant for the first time.  Being pregnant with more than one baby.  Becoming pregnant using fertilization methods, such as IVF (in vitro fertilization).  Having other medical problems, such as diabetes, kidney disease, or lupus. What can I do to lower my risk? The exact cause of hypertension during pregnancy is not known. You may be able to lower your risk by:  Maintaining a healthy weight.  Eating a healthy and balanced diet.  Following your health care provider's instructions about treating any long-term conditions that you had before becoming pregnant. It is very important to keep all of your prenatal care appointments. Your health care provider will check your blood pressure and make sure that your pregnancy is progressing as expected. If a problem is found, early treatment can prevent complications.   How is this treated? Treatment for hypertension during pregnancy varies depending on the type of hypertension you have and how serious it is.  If you were   taking medicine for high blood pressure before you became pregnant, talk with your health care provider. You may need to change medicine during  pregnancy because some medicines, like ACE inhibitors, may not be considered safe for your baby.  If you have gestational hypertension, your health care provider may order medicine to treat this during pregnancy.  If you are at risk for preeclampsia, your health care provider may recommend that you take a low-dose aspirin during your pregnancy.  If you have severe hypertension, you may need to be hospitalized so you and your baby can be monitored closely. You may also need to be given medicine to lower your blood pressure.  In some cases, if your condition gets worse, you may need to deliver your baby early. Follow these instructions at home: Eating and drinking  Drink enough fluid to keep your urine pale yellow.  Avoid caffeine.   Lifestyle  Do not use any products that contain nicotine or tobacco. These products include cigarettes, chewing tobacco, and vaping devices, such as e-cigarettes. If you need help quitting, ask your health care provider.  Do not use alcohol or drugs.  Avoid stress as much as possible.  Rest and get plenty of sleep.  Regular exercise can help to reduce your blood pressure. Ask your health care provider what kinds of exercise are best for you. General instructions  Take over-the-counter and prescription medicines only as told by your health care provider.  Keep all prenatal and follow-up visits. This is important. Contact a health care provider if:  You have symptoms that your health care provider told you may require more treatment or monitoring, such as: ? Headaches. ? Nausea or vomiting. ? Abdominal pain. ? Dizziness. ? Light-headedness. Get help right away if:  You have symptoms of serious complications, such as: ? Severe abdominal pain that does not get better with treatment. ? A severe headache that does not get better, blurred vision, or double vision. ? Vomiting that does not get better. ? Sudden, rapid weight gain or swelling in your  hands, ankles, or face. ? Vaginal bleeding. ? Blood in your urine. ? Shortness of breath or chest pain. ? Weakness on one side of your body or difficulty speaking.  Your baby is not moving as much as usual. These symptoms may represent a serious problem that is an emergency. Do not wait to see if the symptoms will go away. Get medical help right away. Call your local emergency services (911 in the [place] Do not drive yourself to the hospital. Summary  Hypertension during pregnancy can cause problems for you and your baby.  Treatment for hypertension during pregnancy varies depending on the type of hypertension you have and how serious it is.  Keep all prenatal and follow-up visits. This is important.  Get help right away if you have symptoms of serious complications related to high blood pressure. This information is not intended to replace advice given to you by your health care provider. Make sure you discuss any questions you have with your health care provider. Document Revised: 03/01/2020 Document Reviewed: 03/01/2020 Elsevier Patient Education  2021 Elsevier Inc.  

## 2020-09-21 NOTE — MAU Provider Note (Signed)
History     CSN: [id number]  Arrival date and time: 09/21/20 1223   Event Date/Time   First Provider Initiated Contact with Patient 09/21/20 1331      Chief Complaint  Patient presents with  . Rupture of Membranes   HPI  [name] is a 35 y.o. female G32P0101 @ [redacted]w[redacted]d here in MAU with concerns about leaking of fluid.  Clear fluid, without odor 1x yesterday afternoon. Did not have any further trickles. Feels like she has more discharge with this pregnancy than previous pregnancy.  No blood or color to the discharge. No pain. + fetal movement. Had preterm delivery with last baby @ 33 weeks d/t Preeclampsia.   OB History    Gravida  2   Para  1   Term      Preterm  1   AB      Living  1     SAB      IAB      Ectopic      Multiple  0   Live Births  1           Past Medical History:  Diagnosis Date  . Hypothyroidism     Past Surgical History:  Procedure Laterality Date  . IVF retrievals    . ROTATOR CUFF REPAIR Left   . WISDOM TOOTH EXTRACTION      Family History  Problem Relation Age of Onset  . Hyperlipidemia Father   . Hyperlipidemia Maternal Grandmother   . Heart failure Paternal Grandmother     Social History   Tobacco Use  . Smoking status: Never Smoker  . Smokeless tobacco: Never Used  Vaping Use  . Vaping Use: Never used  Substance Use Topics  . Alcohol use: No  . Drug use: No    Allergies:  Allergies  Allergen Reactions  . Amoxicillin     Has patient had a PCN reaction causing immediate rash, facial/tongue/throat swelling, SOB or lightheadedness with hypotension: Yes Has patient had a PCN reaction causing severe rash involving mucus membranes or skin necrosis: Yes Has patient had a PCN reaction that required hospitalization: no Has patient had a PCN reaction occurring within the last 10 years: No If all of the above answers are "NO", then may proceed with Cephalosporin use.  Marland Kitchen Penicillins     Medications Prior to  Admission  Medication Sig Dispense Refill Last Dose  . aspirin EC 81 MG tablet Take 81 mg by mouth daily. Swallow whole.   09/20/2020 at Unknown time  . levothyroxine (SYNTHROID) 25 MCG tablet Take 25 mcg by mouth daily before breakfast.   09/21/2020 at Unknown time  . Prenatal Vit-Fe Fumarate-FA (PRENATAL MULTIVITAMIN) TABS tablet Take 1 tablet by mouth daily at 12 noon.   09/20/2020 at Unknown time   Results for orders placed or performed during the hospital encounter of 09/21/20 (from the past 48 hour(s))  Urinalysis, Routine w reflex microscopic Urine, Clean Catch     Status: Abnormal   Collection Time: 09/21/20  1:00 PM  Result Value Ref Range   Color, Urine YELLOW YELLOW   APPearance HAZY (A) CLEAR   Specific Gravity, Urine 1.015 1.005 - 1.030   pH 7.0 5.0 - 8.0   Glucose, UA NEGATIVE NEGATIVE mg/dL   Hgb urine dipstick NEGATIVE NEGATIVE   Bilirubin Urine NEGATIVE NEGATIVE   Ketones, ur 20 (A) NEGATIVE mg/dL   Protein, ur NEGATIVE NEGATIVE mg/dL   Nitrite NEGATIVE NEGATIVE   Leukocytes,Ua NEGATIVE NEGATIVE  Comment: Performed at [facility], [place] [address] [place], [place] [address]  Protein / creatinine ratio, urine     Status: None   Collection Time: 09/21/20  1:10 PM  Result Value Ref Range   Creatinine, Urine 109.98 mg/dL   Total Protein, Urine 13 mg/dL    Comment: NO NORMAL RANGE ESTABLISHED FOR THIS TEST   Protein Creatinine Ratio 0.12 0.00 - 0.15 mg/mg[Cre]    Comment: Performed at [facility] [address] [place], [place] [address]  CBC     Status: Abnormal   Collection Time: 09/21/20  1:18 PM  Result Value Ref Range   WBC 11.8 (H) 4.0 - 10.5 K/uL   RBC 3.69 (L) 3.87 - 5.11 MIL/uL   Hemoglobin 12.1 12.0 - 15.0 g/dL   HCT 34.5 (L) 36.0 - 46.0 %   MCV 93.5 80.0 - 100.0 fL   MCH 32.8 26.0 - 34.0 pg   MCHC 35.1 30.0 - 36.0 g/dL   RDW 13.0 11.5 - 15.5 %   Platelets 235 150 - 400 K/uL   nRBC 0.0 0.0 - 0.2 %    Comment: Performed at [facility], [place] [address], [place], [place] [address]  Comprehensive metabolic panel     Status: Abnormal   Collection Time: 09/21/20  1:18 PM  Result Value Ref Range   Sodium 136 135 - 145 mmol/L   Potassium 3.5 3.5 - 5.1 mmol/L   Chloride 106 98 - 111 mmol/L   CO2 23 22 - 32 mmol/L   Glucose, Bld 88 70 - 99 mg/dL    Comment: Glucose reference range applies only to samples taken after fasting for at least 8 hours.   BUN 6 6 - 20 mg/dL   Creatinine, Ser 0.66 0.44 - 1.00 mg/dL   Calcium 8.5 (L) 8.9 - 10.3 mg/dL   Total Protein 6.7 6.5 - 8.1 g/dL   Albumin 3.0 (L) 3.5 - 5.0 g/dL   AST 26 15 - 41 U/L   ALT 20 0 - 44 U/L   Alkaline Phosphatase 76 38 - 126 U/L   Total Bilirubin 1.5 (H) 0.3 - 1.2 mg/dL   GFR, Estimated >60 >60 mL/min    Comment: (NOTE) Calculated using the CKD-EPI Creatinine Equation (2021)    Anion gap 7 5 - 15    Comment: Performed at [facility], [place] [address] [place], [place] [address]  Amnisure rupture of membrane (rom)not at [facility]     Status: None   Collection Time: 09/21/20  1:42 PM  Result Value Ref Range   Amnisure ROM NEGATIVE     Comment: Performed at [facility], [address] [place], [place] [address]  Wet prep, genital     Status: Abnormal   Collection Time: 09/21/20  1:52 PM   Specimen: Vaginal  Result Value Ref Range   Yeast Wet Prep HPF POC NONE SEEN NONE SEEN   Trich, Wet Prep NONE SEEN NONE SEEN   Clue Cells Wet Prep HPF POC NONE SEEN NONE SEEN   WBC, Wet Prep HPF POC MANY (A) NONE SEEN   Sperm NONE SEEN     Comment: Performed at [facility], [place] [address], [place], [place] [address]   Review of Systems  Gastrointestinal: Negative for abdominal pain.  Genitourinary: Positive for vaginal discharge. Negative for vaginal bleeding.   Physical Exam   Blood pressure 124/77, pulse 96, temperature 97.9 F (36.6 C), temperature  source Oral, resp. rate 16, height 5' 6.5" (1.689 m), weight 70.8 kg, last menstrual period  03/07/2020, SpO2 100 %.  Physical Exam Constitutional:      General: She is not in acute distress.    Appearance: Normal appearance. She is obese. She is not ill-appearing, toxic-appearing or diaphoretic.  Genitourinary:    Vagina: Vaginal discharge present.     Comments: Vagina - Small-moderate amount of white vaginal discharge, no odor, no pooling of fluid  Cervix - No contact bleeding, no active bleeding  Bimanual exam: deferred  Wet prep, amnisure, fern slide done Chaperone present for exam.  Neurological:     Mental Status: She is alert and oriented to person, place, and time.  Psychiatric:        Behavior: Behavior normal.    Fetal Tracing: Baseline: 130 bpm Variability: Moderate  Accelerations: 15x15 Decelerations: None Toco: None  MAU Course  Procedures  None  MDM  Wet prep, fern negative, amnisure. Reviewed labs in detail with patient and partner.   Assessment and Plan   A:  1. Encounter for suspected PROM, with rupture of membranes not found   2. Elevated BP without diagnosis of hypertension   3. [redacted] weeks gestation of pregnancy     P:  Discharge home in stable condition  Pre E precautions Keep your visit in the office on Weds for BP check Increased vaginal discharge is normal in the 3rd trimester Return to MAU if symptoms worsen  Chrisma Hurlock, Artist Pais, NP 09/21/2020 4:04 PM

## 2020-09-21 NOTE — MAU Note (Signed)
Kimberly Gomez is a 35 y.o. at [redacted]w[redacted]d here in MAU reporting: had a big gush of fluid yesterday, states it soaked through her clothes onto the couch. Unsure if she is still leaking, states she has some discharge and some sweat. No bleeding or pain. +FM  Onset of complaint: yesterday  Pain score: 0/10  Vitals:   09/21/20 1240  BP: (!) 156/87  Pulse: 89  Resp: 16  Temp: 97.9 F (36.6 C)  SpO2: 100%     FHT:167  Lab orders placed from triage: UA

## 2020-09-26 DIAGNOSIS — O99810 Abnormal glucose complicating pregnancy: Secondary | ICD-10-CM | POA: Diagnosis not present

## 2020-09-28 ENCOUNTER — Encounter: Payer: Self-pay | Admitting: Internal Medicine

## 2020-10-16 DIAGNOSIS — Z369 Encounter for antenatal screening, unspecified: Secondary | ICD-10-CM | POA: Diagnosis not present

## 2020-10-18 ENCOUNTER — Encounter: Payer: Self-pay | Admitting: Internal Medicine

## 2020-10-19 ENCOUNTER — Encounter: Payer: Self-pay | Admitting: Family Medicine

## 2020-10-25 DIAGNOSIS — H52223 Regular astigmatism, bilateral: Secondary | ICD-10-CM | POA: Diagnosis not present

## 2020-10-25 DIAGNOSIS — H5213 Myopia, bilateral: Secondary | ICD-10-CM | POA: Diagnosis not present

## 2020-10-29 DIAGNOSIS — Z3A33 33 weeks gestation of pregnancy: Secondary | ICD-10-CM | POA: Diagnosis not present

## 2020-10-29 DIAGNOSIS — O09813 Supervision of pregnancy resulting from assisted reproductive technology, third trimester: Secondary | ICD-10-CM | POA: Diagnosis not present

## 2020-11-12 DIAGNOSIS — Z369 Encounter for antenatal screening, unspecified: Secondary | ICD-10-CM | POA: Diagnosis not present

## 2020-11-13 ENCOUNTER — Encounter: Payer: Self-pay | Admitting: Internal Medicine

## 2020-11-23 DIAGNOSIS — Z3480 Encounter for supervision of other normal pregnancy, unspecified trimester: Secondary | ICD-10-CM | POA: Diagnosis not present

## 2020-11-23 DIAGNOSIS — Z369 Encounter for antenatal screening, unspecified: Secondary | ICD-10-CM | POA: Diagnosis not present

## 2020-11-23 LAB — OB RESULTS CONSOLE GBS: GBS: POSITIVE

## 2020-11-30 DIAGNOSIS — Z369 Encounter for antenatal screening, unspecified: Secondary | ICD-10-CM | POA: Diagnosis not present

## 2020-12-05 DIAGNOSIS — Z3A39 39 weeks gestation of pregnancy: Secondary | ICD-10-CM | POA: Diagnosis not present

## 2020-12-05 DIAGNOSIS — O26843 Uterine size-date discrepancy, third trimester: Secondary | ICD-10-CM | POA: Diagnosis not present

## 2020-12-06 ENCOUNTER — Inpatient Hospital Stay (HOSPITAL_COMMUNITY): Payer: 59 | Admitting: Anesthesiology

## 2020-12-06 ENCOUNTER — Inpatient Hospital Stay (HOSPITAL_COMMUNITY)
Admission: AD | Admit: 2020-12-06 | Discharge: 2020-12-08 | DRG: 807 | Disposition: A | Discharge status: Home / Self Care | Payer: 59 | Attending: Obstetrics & Gynecology | Admitting: Obstetrics & Gynecology

## 2020-12-06 ENCOUNTER — Encounter (HOSPITAL_COMMUNITY): Payer: Self-pay | Admitting: Obstetrics & Gynecology

## 2020-12-06 ENCOUNTER — Other Ambulatory Visit: Payer: Self-pay

## 2020-12-06 DIAGNOSIS — O4290 Premature rupture of membranes, unspecified as to length of time between rupture and onset of labor, unspecified weeks of gestation: Secondary | ICD-10-CM | POA: Diagnosis present

## 2020-12-06 DIAGNOSIS — O4292 Full-term premature rupture of membranes, unspecified as to length of time between rupture and onset of labor: Principal | ICD-10-CM | POA: Diagnosis present

## 2020-12-06 DIAGNOSIS — R030 Elevated blood-pressure reading, without diagnosis of hypertension: Secondary | ICD-10-CM | POA: Diagnosis not present

## 2020-12-06 DIAGNOSIS — E039 Hypothyroidism, unspecified: Secondary | ICD-10-CM | POA: Diagnosis present

## 2020-12-06 DIAGNOSIS — O329XX0 Maternal care for malpresentation of fetus, unspecified, not applicable or unspecified: Secondary | ICD-10-CM | POA: Diagnosis not present

## 2020-12-06 DIAGNOSIS — O9902 Anemia complicating childbirth: Secondary | ICD-10-CM | POA: Diagnosis not present

## 2020-12-06 DIAGNOSIS — D649 Anemia, unspecified: Secondary | ICD-10-CM | POA: Diagnosis not present

## 2020-12-06 DIAGNOSIS — Z20822 Contact with and (suspected) exposure to covid-19: Secondary | ICD-10-CM | POA: Diagnosis not present

## 2020-12-06 DIAGNOSIS — O99284 Endocrine, nutritional and metabolic diseases complicating childbirth: Secondary | ICD-10-CM | POA: Diagnosis not present

## 2020-12-06 DIAGNOSIS — O164 Unspecified maternal hypertension, complicating childbirth: Secondary | ICD-10-CM | POA: Diagnosis not present

## 2020-12-06 DIAGNOSIS — O99824 Streptococcus B carrier state complicating childbirth: Secondary | ICD-10-CM | POA: Diagnosis present

## 2020-12-06 DIAGNOSIS — Z3A39 39 weeks gestation of pregnancy: Secondary | ICD-10-CM | POA: Diagnosis not present

## 2020-12-06 DIAGNOSIS — O26893 Other specified pregnancy related conditions, third trimester: Secondary | ICD-10-CM | POA: Diagnosis present

## 2020-12-06 DIAGNOSIS — Z3A00 Weeks of gestation of pregnancy not specified: Secondary | ICD-10-CM | POA: Diagnosis not present

## 2020-12-06 HISTORY — DX: Premature rupture of membranes, unspecified as to length of time between rupture and onset of labor, unspecified weeks of gestation: O42.90

## 2020-12-06 LAB — TYPE AND SCREEN
ABO/RH(D): A POS
Antibody Screen: NEGATIVE

## 2020-12-06 LAB — CBC
HCT: 38.6 % (ref 36.0–46.0)
Hemoglobin: 13.2 g/dL (ref 12.0–15.0)
MCH: 31.7 pg (ref 26.0–34.0)
MCHC: 34.2 g/dL (ref 30.0–36.0)
MCV: 92.8 fL (ref 80.0–100.0)
Platelets: 182 10*3/uL (ref 150–400)
RBC: 4.16 MIL/uL (ref 3.87–5.11)
RDW: 12.6 % (ref 11.5–15.5)
WBC: 12.4 10*3/uL — ABNORMAL HIGH (ref 4.0–10.5)
nRBC: 0 % (ref 0.0–0.2)

## 2020-12-06 LAB — RESP PANEL BY RT-PCR (FLU A&B, COVID) ARPGX2
Influenza A by PCR: NEGATIVE
Influenza B by PCR: NEGATIVE
SARS Coronavirus 2 by RT PCR: NEGATIVE

## 2020-12-06 LAB — COMPREHENSIVE METABOLIC PANEL
ALT: 17 U/L (ref 0–44)
AST: 29 U/L (ref 15–41)
Albumin: 2.8 g/dL — ABNORMAL LOW (ref 3.5–5.0)
Alkaline Phosphatase: 241 U/L — ABNORMAL HIGH (ref 38–126)
Anion gap: 7 (ref 5–15)
BUN: 8 mg/dL (ref 6–20)
CO2: 23 mmol/L (ref 22–32)
Calcium: 9 mg/dL (ref 8.9–10.3)
Chloride: 107 mmol/L (ref 98–111)
Creatinine, Ser: 1.04 mg/dL — ABNORMAL HIGH (ref 0.44–1.00)
GFR, Estimated: >60 mL/min (ref >60)
Glucose, Bld: 101 mg/dL — ABNORMAL HIGH (ref 70–99)
Potassium: 3.9 mmol/L (ref 3.5–5.1)
Sodium: 137 mmol/L (ref 135–145)
Total Bilirubin: 1.4 mg/dL — ABNORMAL HIGH (ref 0.3–1.2)
Total Protein: 6.5 g/dL (ref 6.5–8.1)

## 2020-12-06 LAB — PROTEIN / CREATININE RATIO, URINE
Creatinine, Urine: 72.64 mg/dL
Protein Creatinine Ratio: 0.15 mg/mg{Cre} (ref 0.00–0.15)
Total Protein, Urine: 11 mg/dL

## 2020-12-06 LAB — POCT FERN TEST: POCT Fern Test: POSITIVE

## 2020-12-06 MED ORDER — OXYCODONE-ACETAMINOPHEN 5-325 MG PO TABS: 2.0000 | ORAL_TABLET | ORAL | Status: DC | PRN

## 2020-12-06 MED ORDER — WITCH HAZEL-GLYCERIN EX PADS: 1.0000 "application " | MEDICATED_PAD | CUTANEOUS | Status: DC | PRN

## 2020-12-06 MED ORDER — ONDANSETRON HCL 4 MG PO TABS: 4.0000 mg | ORAL_TABLET | ORAL | Status: DC | PRN

## 2020-12-06 MED ORDER — TETANUS-DIPHTH-ACELL PERTUSSIS 5-2.5-18.5 LF-MCG/0.5 IM SUSY: 0.5000 mL | PREFILLED_SYRINGE | Freq: Once | INTRAMUSCULAR | Status: DC

## 2020-12-06 MED ORDER — BISACODYL 10 MG RE SUPP: 10.0000 mg | Freq: Every day | RECTAL | Status: DC | PRN

## 2020-12-06 MED ORDER — OXYTOCIN BOLUS FROM INFUSION
333.0000 mL | Freq: Once | INTRAVENOUS | Status: AC
  Administered 2020-12-06: 333 mL via INTRAVENOUS

## 2020-12-06 MED ORDER — OXYCODONE HCL 5 MG PO TABS: 10.0000 mg | ORAL_TABLET | ORAL | Status: DC | PRN

## 2020-12-06 MED ORDER — LACTATED RINGERS IV SOLN: 500.0000 mL | INTRAVENOUS | Status: DC | PRN

## 2020-12-06 MED ORDER — DIPHENHYDRAMINE HCL 25 MG PO CAPS: 25.0000 mg | ORAL_CAPSULE | Freq: Four times a day (QID) | ORAL | Status: DC | PRN

## 2020-12-06 MED ORDER — COCONUT OIL OIL: 1.0000 "application " | TOPICAL_OIL | Status: DC | PRN

## 2020-12-06 MED ORDER — OXYCODONE-ACETAMINOPHEN 5-325 MG PO TABS: 1.0000 | ORAL_TABLET | ORAL | Status: DC | PRN

## 2020-12-06 MED ORDER — ACETAMINOPHEN 325 MG PO TABS
650.0000 mg | ORAL_TABLET | ORAL | Status: DC | PRN
  Administered 2020-12-06: 650 mg via ORAL
  Filled 2020-12-06: qty 2

## 2020-12-06 MED ORDER — SODIUM CHLORIDE 0.9% FLUSH: 3.0000 mL | INTRAVENOUS | Status: DC | PRN

## 2020-12-06 MED ORDER — PHENYLEPHRINE 40 MCG/ML (10ML) SYRINGE FOR IV PUSH (FOR BLOOD PRESSURE SUPPORT): 80.0000 ug | PREFILLED_SYRINGE | INTRAVENOUS | Status: DC | PRN

## 2020-12-06 MED ORDER — DIBUCAINE (PERIANAL) 1 % EX OINT: 1.0000 "application " | TOPICAL_OINTMENT | CUTANEOUS | Status: DC | PRN

## 2020-12-06 MED ORDER — FLEET ENEMA 7-19 GM/118ML RE ENEM: 1.0000 | ENEMA | Freq: Every day | RECTAL | Status: DC | PRN

## 2020-12-06 MED ORDER — SIMETHICONE 80 MG PO CHEW: 80.0000 mg | CHEWABLE_TABLET | ORAL | Status: DC | PRN

## 2020-12-06 MED ORDER — OXYTOCIN-SODIUM CHLORIDE 30-0.9 UT/500ML-% IV SOLN
2.5000 [IU]/h | INTRAVENOUS | Status: DC
  Filled 2020-12-06: qty 500

## 2020-12-06 MED ORDER — ACETAMINOPHEN 325 MG PO TABS: 650.0000 mg | ORAL_TABLET | ORAL | Status: DC | PRN

## 2020-12-06 MED ORDER — LIDOCAINE HCL (PF) 1 % IJ SOLN
INTRAMUSCULAR | Status: DC | PRN
  Administered 2020-12-06: 11 mL via EPIDURAL

## 2020-12-06 MED ORDER — EPHEDRINE 5 MG/ML INJ: 10.0000 mg | INTRAVENOUS | Status: DC | PRN

## 2020-12-06 MED ORDER — LIDOCAINE HCL (PF) 1 % IJ SOLN: 30.0000 mL | INTRAMUSCULAR | Status: DC | PRN

## 2020-12-06 MED ORDER — ONDANSETRON HCL 4 MG/2ML IJ SOLN
4.0000 mg | Freq: Four times a day (QID) | INTRAMUSCULAR | Status: DC | PRN
  Administered 2020-12-06: 4 mg via INTRAVENOUS
  Filled 2020-12-06: qty 2

## 2020-12-06 MED ORDER — VANCOMYCIN HCL IN DEXTROSE 1-5 GM/200ML-% IV SOLN
1000.0000 mg | Freq: Two times a day (BID) | INTRAVENOUS | Status: DC
  Administered 2020-12-06: 1000 mg via INTRAVENOUS
  Filled 2020-12-06: qty 200

## 2020-12-06 MED ORDER — LACTATED RINGERS IV SOLN: 500.0000 mL | Freq: Once | INTRAVENOUS | Status: DC

## 2020-12-06 MED ORDER — DIPHENHYDRAMINE HCL 50 MG/ML IJ SOLN: 12.5000 mg | INTRAMUSCULAR | Status: DC | PRN

## 2020-12-06 MED ORDER — SENNOSIDES-DOCUSATE SODIUM 8.6-50 MG PO TABS
2.0000 | ORAL_TABLET | ORAL | Status: DC
  Administered 2020-12-07: 2 via ORAL
  Filled 2020-12-06 (×2): qty 2

## 2020-12-06 MED ORDER — LACTATED RINGERS IV SOLN: INTRAVENOUS | Status: DC

## 2020-12-06 MED ORDER — FENTANYL-BUPIVACAINE-NACL 0.5-0.125-0.9 MG/250ML-% EP SOLN
12.0000 mL/h | EPIDURAL | Status: DC | PRN
Start: 2020-12-06 — End: 2020-12-06
  Administered 2020-12-06: 12 mL/h via EPIDURAL

## 2020-12-06 MED ORDER — SODIUM CHLORIDE 0.9 % IV SOLN: 250.0000 mL | INTRAVENOUS | Status: DC | PRN

## 2020-12-06 MED ORDER — IBUPROFEN 600 MG PO TABS
600.0000 mg | ORAL_TABLET | Freq: Four times a day (QID) | ORAL | Status: DC
  Administered 2020-12-06 – 2020-12-08 (×7): 600 mg via ORAL
  Filled 2020-12-06 (×7): qty 1

## 2020-12-06 MED ORDER — FENTANYL CITRATE (PF) 100 MCG/2ML IJ SOLN
50.0000 ug | INTRAMUSCULAR | Status: DC | PRN
  Administered 2020-12-06: 100 ug via INTRAVENOUS
  Filled 2020-12-06: qty 2

## 2020-12-06 MED ORDER — PRENATAL MULTIVITAMIN CH
1.0000 | ORAL_TABLET | Freq: Every day | ORAL | Status: DC
  Administered 2020-12-07: 1 via ORAL
  Filled 2020-12-06: qty 1

## 2020-12-06 MED ORDER — BENZOCAINE-MENTHOL 20-0.5 % EX AERO
1.0000 "application " | INHALATION_SPRAY | CUTANEOUS | Status: DC | PRN
  Administered 2020-12-06: 1 via TOPICAL
  Filled 2020-12-06: qty 56

## 2020-12-06 MED ORDER — SODIUM CHLORIDE 0.9% FLUSH: 3.0000 mL | Freq: Two times a day (BID) | INTRAVENOUS | Status: DC

## 2020-12-06 MED ORDER — SOD CITRATE-CITRIC ACID 500-334 MG/5ML PO SOLN
30.0000 mL | ORAL | Status: DC | PRN
Start: 2020-12-06 — End: 2020-12-06

## 2020-12-06 MED ORDER — LEVOTHYROXINE SODIUM 50 MCG PO TABS
25.0000 ug | ORAL_TABLET | Freq: Every day | ORAL | Status: DC
  Filled 2020-12-06 (×2): qty 1

## 2020-12-06 MED ORDER — ONDANSETRON HCL 4 MG/2ML IJ SOLN: 4.0000 mg | INTRAMUSCULAR | Status: DC | PRN

## 2020-12-06 MED ORDER — OXYCODONE HCL 5 MG PO TABS: 5.0000 mg | ORAL_TABLET | ORAL | Status: DC | PRN

## 2020-12-06 MED ORDER — FENTANYL-BUPIVACAINE-NACL 0.5-0.125-0.9 MG/250ML-% EP SOLN
EPIDURAL | Status: AC
  Filled 2020-12-06: qty 250

## 2020-12-06 NOTE — Lactation Note (Signed)
This note was copied from a baby's chart. Lactation Consultation Note  Patient Name: Kimberly Gomez [id number] Date: 12/06/2020 Reason for consult: L&D Initial assessment Age:36 hours P2, term female infant. 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  Mom was doing STS when [facility] entered the room, infant was cuing to BF. Mom latched infant on her left breast using the cross cradle hold position, infant latched with depth, took a few attempts, infant started sustaining latch, was still BF after 11 minutes when LC left the room. Mom knows to BF infant according to primal cues: licking, tasting, rooting, smacking, had and fist in mouth, BF infant STS. Mom knows to call RN or [place] on MBU if she needs further assistance with latching infant at the breast.  Maternal Data Does the patient have breastfeeding experience prior to this delivery?: Yes How long did the patient breastfeed?: Per mom, 1st child was in NICU but she BF her until 11 months and supplemented with donor milk and then formula.  Feeding Mother's Current Feeding Choice: Breast Milk  LATCH Score Latch: Repeated attempts needed to sustain latch, nipple held in mouth throughout feeding, stimulation needed to elicit sucking reflex. (Infant started sustaining latch, was still BF when LC left the room.)  Audible Swallowing: A few with stimulation  Type of Nipple: Everted at rest and after stimulation  Comfort (Breast/Nipple): Soft / non-tender  Hold (Positioning): Assistance needed to correctly position infant at breast and maintain latch.  LATCH Score: 7   Lactation Tools Discussed/Used    Interventions Interventions: Assisted with latch;Skin to skin;Breast compression;Adjust position;Support pillows;Position options;Education  Discharge [place] Program: No  Consult Status Consult Status: Follow-up Date: 12/07/20 Follow-up type: In-patient    [name] 12/06/2020, 4:15 PM

## 2020-12-06 NOTE — Anesthesia Procedure Notes (Signed)
Epidural Patient location during procedure: OB Start time: 12/06/2020 2:00 PM End time: 12/06/2020 2:13 PM  Staffing Anesthesiologist: Lynda Rainwater, MD  Preanesthetic Checklist Completed: patient identified, IV checked, site marked, risks and benefits discussed, surgical consent, monitors and equipment checked, pre-op evaluation and timeout performed  Epidural Patient position: sitting Prep: ChloraPrep Patient monitoring: heart rate, cardiac monitor, continuous pulse ox and blood pressure Approach: midline Injection technique: LOR saline  Needle:  Needle type: Tuohy  Needle gauge: 17 G Needle length: 9 cm Needle insertion depth: 4 cm Catheter type: closed end flexible Catheter size: 20 Guage Catheter at skin depth: 8 cm Test dose: negative  Assessment Events: blood not aspirated, injection not painful, no injection resistance, no paresthesia and negative IV test  Additional Notes Epidural placed by SRNA under direct supervisionReason for block:procedure for pain

## 2020-12-06 NOTE — MAU Note (Signed)
Pt reports her water broke around 7am. Clear fluid. Having reg ctx and good fetal movement.

## 2020-12-06 NOTE — Anesthesia Preprocedure Evaluation (Signed)
Anesthesia Evaluation  Patient identified by MRN, date of birth, ID band Patient awake    Reviewed: Allergy & Precautions, NPO status , Patient's Chart, lab work & pertinent test results, reviewed documented beta blocker date and time   Airway Mallampati: II  TM Distance: >3 FB Neck ROM: Full    Dental   Pulmonary neg pulmonary ROS,    Pulmonary exam normal breath sounds clear to auscultation       Cardiovascular hypertension, Pt. on medications and Pt. on home beta blockers Normal cardiovascular exam Rhythm:Regular Rate:Normal     Neuro/Psych negative neurological ROS  negative psych ROS   GI/Hepatic negative GI ROS, Neg liver ROS,   Endo/Other  Hypothyroidism   Renal/GU negative Renal ROS  negative genitourinary   Musculoskeletal negative musculoskeletal ROS (+)   Abdominal   Peds  Hematology  (+) anemia ,   Anesthesia Other Findings   Reproductive/Obstetrics (+) Pregnancy Pre-eclampsia                             Anesthesia Physical  Anesthesia Plan  ASA: 2  Anesthesia Plan: Epidural   Post-op Pain Management:    Induction:   PONV Risk Score and Plan:   Airway Management Planned: Natural Airway  Additional Equipment:   Intra-op Plan:   Post-operative Plan:   Informed Consent: I have reviewed the patients History and Physical, chart, labs and discussed the procedure including the risks, benefits and alternatives for the proposed anesthesia with the patient or authorized representative who has indicated his/her understanding and acceptance.       Plan Discussed with:   Anesthesia Plan Comments: (Labs reviewed. Platelets acceptable, patient not taking any blood thinning medications. Risks and benefits discussed with patient, patient expressed understanding and wished to proceed.)        Anesthesia Quick Evaluation

## 2020-12-06 NOTE — Progress Notes (Signed)
Vertex presentation confirmed with bedside ultrasound  Mallie Snooks, MSN, CNM Certified Nurse Midwife, [facility] for [employer], [facility] 12/06/20 10:53 AM

## 2020-12-06 NOTE — H&P (Signed)
Kimberly Gomez is a 35 y.o. female G79P0101 [redacted]w[redacted]d presenting for LOF at 7am and contractions. Reports normal FM. Denies VB. DeniesHA/VC/RUQ pain/SOB  Pregnancy c/b: Hypothyroidism, borderline, started synthroid 14mcg for infertility IVF pregnancy, declined genetic testing. Fetal echo and anatomy normal H/o severe preeclampsia G1, delivery at 33w  OB History     Gravida  2   Para  1   Term      Preterm  1   AB      Living  1      SAB      IAB      Ectopic      Multiple  0   Live Births  1          Past Medical History:  Diagnosis Date   Hypothyroidism    Past Surgical History:  Procedure Laterality Date   IVF retrievals     ROTATOR CUFF REPAIR Left    WISDOM TOOTH EXTRACTION     Family History: family history includes Heart failure in her paternal grandmother; Hyperlipidemia in her father and maternal grandmother. Social History:  reports that she has never smoked. She has never used smokeless tobacco. She reports that she does not drink alcohol and does not use drugs.     Maternal Diabetes: No Genetic Screening: Declined Maternal Ultrasounds/Referrals: Normal anatomy scan. EFW 6/15 53% Fetal Ultrasounds or other Referrals:  Referred to [facility]  for anatomy. Fetal echo normal  Maternal Substance Abuse:  No Significant Maternal Medications:  synthroid  Significant Maternal Lab Results:  Group B Strep positive Other Comments:   IVF pregnancy, history of severe preeclampsia, hypothyroidism   Review of Systems Per HPI Exam Physical Exam  Dilation: 3.5 Exam by:: in office Blood pressure (!) 132/95, pulse 97, temperature 98.2 F (36.8 C), resp. rate 18, height 5' 6.5" (1.689 m), weight 73 kg, last menstrual period 03/07/2020, SpO2 98 %. NAD, resting comfortably, Gravid abdomen Card RRR Pulm CTAB Lower extremity trace edema, no evidence of DVT Fetal testing: FHR 130, Cat 1. Toco q49m Prenatal labs: ABO, Rh:    Antibody:   Rubella:  Immune (12/02 0000) RPR: Nonreactive (03/28 0000)  HBsAg: Negative (12/02 0000)  HIV: Non-reactive (12/02 0000)  GBS: Positive/-- (06/03 0000)   Hematology Recent Labs  Lab 12/06/20 1114  WBC 12.4*  RBC 4.16  HGB 13.2  HCT 38.6  MCV 92.8  MCH 31.7  MCHC 34.2  RDW 12.6  PLT 182   CMP, UPC pending  Assessment/Plan: Kimberly Gomez is a 35 y.o. female G73P0101 [redacted]w[redacted]d admitted with ROM/labor.  Labor: reports ROM 7am, having regular contractions. Ordered Vanc for GBS+. Augment with pitocin prn.   Newly elevated BP on admit: CBC wnl, asymptomatic, CMP/UPC pending. Continue to monitor s/sx preeclampsia Hypothyroidism, borderline, started synthroid 64mcg for infertility  Vaccines: s/p tdap in pregnancy, flu, COVID series and booster.   Kimberly Gomez 12/06/2020, 12:08 PM

## 2020-12-07 LAB — CBC
HCT: 33 % — ABNORMAL LOW (ref 36.0–46.0)
Hemoglobin: 11.4 g/dL — ABNORMAL LOW (ref 12.0–15.0)
MCH: 32.3 pg (ref 26.0–34.0)
MCHC: 34.5 g/dL (ref 30.0–36.0)
MCV: 93.5 fL (ref 80.0–100.0)
Platelets: 185 10*3/uL (ref 150–400)
RBC: 3.53 MIL/uL — ABNORMAL LOW (ref 3.87–5.11)
RDW: 12.7 % (ref 11.5–15.5)
WBC: 19.7 10*3/uL — ABNORMAL HIGH (ref 4.0–10.5)
nRBC: 0 % (ref 0.0–0.2)

## 2020-12-07 LAB — RPR: RPR Ser Ql: NONREACTIVE

## 2020-12-07 NOTE — Anesthesia Postprocedure Evaluation (Signed)
Anesthesia Post Note  Patient: Kimberly Gomez  Procedure(s) Performed: AN AD HOC LABOR EPIDURAL     Patient location during evaluation: Mother Baby Anesthesia Type: Epidural Level of consciousness: awake Pain management: satisfactory to patient Vital Signs Assessment: post-procedure vital signs reviewed and stable Respiratory status: spontaneous breathing Cardiovascular status: stable Anesthetic complications: no   No notable events documented.  Last Vitals:  Vitals:   12/07/20 0211 12/07/20 0606  BP: 111/77 (!) 116/93  Pulse: 68 70  Resp: 18 20  Temp: 36.8 C 36.6 C  SpO2: 100% 99%    Last Pain:  Vitals:   12/07/20 0753  TempSrc:   PainSc: 0-No pain   Pain Goal:                   [name]

## 2020-12-07 NOTE — Progress Notes (Signed)
Patient is eating, ambulating, voiding.  Pain control is good.  Pt feeling well, appropriate lochia.  No CP/SOB, no HA, vision changes or epigastric RUQ pain.  Pedal swelling has improved.  Vitals:   12/06/20 1815 12/06/20 2210 12/07/20 0211 12/07/20 0606  BP: 131/89 120/84 111/77 (!) 116/93  Pulse: 93 79 68 70  Resp: 16 16 18 20   Temp: 99.1 F (37.3 C) 99.5 F (37.5 C) 98.2 F (36.8 C) 97.8 F (36.6 C)  TempSrc: Oral Oral Oral Oral  SpO2: 100% 100% 100% 99%  Weight:      Height:        Fundus firm Ext: no calf tenderness  Lab Results  Component Value Date   WBC 19.7 (H) 12/07/2020   HGB 11.4 (L) 12/07/2020   HCT 33.0 (L) 12/07/2020   MCV 93.5 12/07/2020   PLT 185 12/07/2020    --/--/A POS (06/16 1100)  A/P Post partum day 1 s/p NSVD with shoulder dystocia Cr yesterday was 1.04, will recheck tomorrow.  Cont. To monitor BPs, to low mind range since delivery, otherwise normal (h.o severe PEC).  ALT/AST/PLT wnl. Baby doing well.  Routine care.  Expect d/c tomorrow.    Allyn Kenner

## 2020-12-07 NOTE — Progress Notes (Signed)
Patient requesting order for synthroid

## 2020-12-07 NOTE — Lactation Note (Signed)
This note was copied from a baby's chart. Lactation Consultation Note  Patient Name: Kimberly Gomez [id number] Date: 12/07/2020 Reason for consult: Follow-up assessment;Other (Comment);Term;Infant weight loss ([employer] employee) Age:35 hours  Visited with mom of 32 hours old FT female, she's a P2 and experienced BF but voiced having a baby in the NICU (her first baby) Vs a baby rooming in with her was a much different experience. She reported moderate breast changes during the pregnancy, this baby was an IVF.  Offered assistance with latch and mom agreed to try STS and wake baby up to feed. She fed for 25 minutes on the left breast in football hold (baby had shoulder dystocia), but needed some assistance with repositioning when hearing clicking sounds. Mom got her [employer] employee pump; she also has hand pump in the room.   Reviewed normal newborn behavior, cluster feeding, feeding cues, size of baby's stomach, lactogenesis II and BF positions, mom likes the football hold.  Feeding plan:  Encouraged mom to feed baby STS 8-12 times/24 hours or sooner if feeding cues are present Hand expression and spoon feeding/pumping were also encouraged  BF brochure, BF resources and feeding diary were reviewed. FOB present and supportive. Parents reported all questions and concerns were answered, they're both aware of [facility] OP services and will call PRN.  Maternal Data    Feeding Mother's Current Feeding Choice: Breast Milk  LATCH Score Latch: Grasps breast easily, tongue down, lips flanged, rhythmical sucking.  Audible Swallowing: A few with stimulation  Type of Nipple: Everted at rest and after stimulation  Comfort (Breast/Nipple): Soft / non-tender  Hold (Positioning): Assistance needed to correctly position infant at breast and maintain latch.  LATCH Score: 8   Lactation Tools Discussed/Used Tools: Pump;Flanges Flange Size: 21 Breast pump type: Manual Pump Education: Setup,  frequency, and cleaning;Milk Storage Reason for Pumping: mother's request Pumping frequency: q 3 hours or just prior feedings Pumped volume: 0 mL (drops)  Interventions Interventions: Breast feeding basics reviewed;Assisted with latch;Skin to skin;Breast massage;Hand express;Breast compression;DEBP;Hand pump;Education  Discharge Pump: Employee Pump;Manual Programmer, applications)  Consult Status Consult Status: Follow-up Date: 12/08/20 Follow-up type: In-patient    Olander Friedl Francene Boyers 12/07/2020, 6:14 PM

## 2020-12-08 LAB — COMPREHENSIVE METABOLIC PANEL
ALT: 19 U/L (ref 0–44)
AST: 36 U/L (ref 15–41)
Albumin: 2.2 g/dL — ABNORMAL LOW (ref 3.5–5.0)
Alkaline Phosphatase: 143 U/L — ABNORMAL HIGH (ref 38–126)
Anion gap: 6 (ref 5–15)
BUN: 12 mg/dL (ref 6–20)
CO2: 24 mmol/L (ref 22–32)
Calcium: 8.2 mg/dL — ABNORMAL LOW (ref 8.9–10.3)
Chloride: 107 mmol/L (ref 98–111)
Creatinine, Ser: 0.89 mg/dL (ref 0.44–1.00)
GFR, Estimated: >60 mL/min (ref >60)
Glucose, Bld: 84 mg/dL (ref 70–99)
Potassium: 3.8 mmol/L (ref 3.5–5.1)
Sodium: 137 mmol/L (ref 135–145)
Total Bilirubin: 0.7 mg/dL (ref 0.3–1.2)
Total Protein: 5.1 g/dL — ABNORMAL LOW (ref 6.5–8.1)

## 2020-12-08 NOTE — Discharge Summary (Signed)
Postpartum Discharge Summary     Patient Name: Kimberly Gomez DOB: 1986/02/27 MRN: [id number]  Date of admission: 12/06/2020 Delivery date:12/06/2020  Delivering provider: Lyda Kalata K  Date of discharge: 12/08/2020  Admitting diagnosis: PROM (premature rupture of membranes) [O42.90] Intrauterine pregnancy: [redacted]w[redacted]d    Secondary diagnosis:  Active Problems:   PROM (premature rupture of membranes)  Additional problems: mild elevations in BP intra and postpartum, resolved at discharge.  Elevated Cr to 1.04, normalized by discharge    Discharge diagnosis: Term Pregnancy Delivered                                              Post partum procedures: none Augmentation: N/A Complications: None  Hospital course: Onset of Labor With Vaginal Delivery      35y.o. yo [id number] admitted in Active Labor on 12/06/2020. Patient had an uncomplicated labor course as follows:  Membrane Rupture Time/Date: 7:00 AM ,12/06/2020   Delivery Method:Vaginal, Spontaneous  Episiotomy: None  Lacerations:  2nd degree;Perineal  Patient had an uncomplicated postpartum course.  She is ambulating, tolerating a regular diet, passing flatus, and urinating well. Patient is discharged home in stable condition on 12/08/20.  Newborn Data: Birth date:12/06/2020  Birth time:3:19 PM  Gender:Female  Living status:Living  Apgars:6 ,8  Weight:3890 g   Magnesium Sulfate received: No BMZ received: No Rhophylac:N/A MMR:N/A T-DaP: Yes Flu: Yes Transfusion:No  Physical exam  Vitals:   12/07/20 0211 12/07/20 0606 12/07/20 1454 12/07/20 2037  BP: 111/77 (!) 116/93 107/71 119/86  Pulse: 68 70 67 66  Resp: '18 20 16 18  ' Temp: 98.2 F (36.8 C) 97.8 F (36.6 C) 98.7 F (37.1 C) 98.6 F (37 C)  TempSrc: Oral Oral Oral Oral  SpO2: 100% 99% 99% 100%  Weight:      Height:       General: alert, cooperative, and no distress Lochia: appropriate Uterine Fundus: firm Incision: N/A DVT  Evaluation: No evidence of DVT seen on physical exam. Labs: Lab Results  Component Value Date   WBC 19.7 (H) 12/07/2020   HGB 11.4 (L) 12/07/2020   HCT 33.0 (L) 12/07/2020   MCV 93.5 12/07/2020   PLT 185 12/07/2020   CMP Latest Ref Rng & Units 12/08/2020  Glucose 70 - 99 mg/dL 84  BUN 6 - 20 mg/dL 12  Creatinine 0.44 - 1.00 mg/dL 0.89  Sodium 135 - 145 mmol/L 137  Potassium 3.5 - 5.1 mmol/L 3.8  Chloride 98 - 111 mmol/L 107  CO2 22 - 32 mmol/L 24  Calcium 8.9 - 10.3 mg/dL 8.2(L)  Total Protein 6.5 - 8.1 g/dL 5.1(L)  Total Bilirubin 0.3 - 1.2 mg/dL 0.7  Alkaline Phos 38 - 126 U/L 143(H)  AST 15 - 41 U/L 36  ALT 0 - 44 U/L 19   Edinburgh Score: Edinburgh Postnatal Depression Scale Screening Tool 12/07/2020  I have been able to laugh and see the funny side of things. 0  I have looked forward with enjoyment to things. 0  I have blamed myself unnecessarily when things went wrong. 0  I have been anxious or worried for no good reason. 0  I have felt scared or panicky for no good reason. 0  Things have been getting on top of me. 0  I have been so unhappy that I have had difficulty sleeping. 0  I have felt sad or miserable. 0  I have been so unhappy that I have been crying. 0  The thought of harming myself has occurred to me. 0  Edinburgh Postnatal Depression Scale Total 0      After visit meds:  Allergies as of 12/08/2020       Reactions   Amoxicillin    Has patient had a PCN reaction causing immediate rash, facial/tongue/throat swelling, SOB or lightheadedness with hypotension: Yes Has patient had a PCN reaction causing severe rash involving mucus membranes or skin necrosis: Yes Has patient had a PCN reaction that required hospitalization: no Has patient had a PCN reaction occurring within the last 10 years: No If all of the above answers are "NO", then may proceed with Cephalosporin use.   Penicillins         Medication List     STOP taking these medications     aspirin EC 81 MG tablet   B-D 3CC LUER-LOK SYR 18GX1-1/2 18G X 1-1/2" 3 ML Misc Generic drug: SYRINGE-NEEDLE (DISP) 3 ML   BD Disp Needles 22G X 1-1/2" Misc Generic drug: NEEDLE (DISP) 22 G   estradiol 2 MG tablet Commonly known as: ESTRACE   progesterone 50 MG/ML injection       TAKE these medications    levothyroxine 25 MCG tablet Commonly known as: SYNTHROID Take 25 mcg by mouth daily before breakfast.   prenatal multivitamin Tabs tablet Take 1 tablet by mouth daily at 12 noon.         Discharge home in stable condition Infant Feeding:  see lactation note Infant Disposition:home with mother Discharge instruction: per After Visit Summary and Postpartum booklet. Activity: Advance as tolerated. Pelvic rest for 6 weeks.  Diet: routine diet Anticipated Birth Control: Unsure Postpartum Appointment:4 weeks, pt will monitor BPs at home at least weekly and call with any elevations or PEC symptoms Additional Postpartum F/U: Postpartum Depression checkup Future Appointments:No future appointments. Follow up Visit:  Follow-up Information     Taam-Akelman, Lawrence Santiago, MD Follow up in 4 week(s).   Specialty: Obstetrics and Gynecology Contact information: [address] [place] [address] [phone]                     12/08/2020 Allyn Kenner, DO

## 2020-12-08 NOTE — Lactation Note (Signed)
This note was copied from a baby's chart. Lactation Consultation Note  Patient Name: Kimberly Gomez [id number] Date: 12/08/2020 Reason for consult: Follow-up assessment Age:35 Hours  P2, mother reports Br and supplementing her first child for 11 months . She reports that she has a sore left nipple. Observed nipple and she has a positional strip and it is very red. Mother was advised to hand express and use comfort gels.  Encouraged good support and proper positioning when latching infant . Advised if feeling painful latch to unlatch infant until she has a wide open mouth. Discussed transitional and mature milk.   Discussed early, mid and late hungry cues. Discussed cluster feeding.  Discussed wt lose and advised to supplement infant with ebm after breastfeeding if infant doesn't have a good feeding. Mother reports that sometime infant is sleepy and hard to wake for feedings but not at night.  Discussed spoon or cup feeding with parents . Advised to page for assistance when infant is ready for next feeding.  Infant sleeping soundly on mothers chest at present.   Discussed treatment and prevention of engorgement.   Mother to continue to cue base feed infant and feed at least 8-12 times or more in 24 hours and advised to allow for cluster feeding infant as needed.  Mother to continue to due STS. Mother is aware of available LC services at [facility], BFSG'S, OP Dept, and phone # for questions or concerns about breastfeeding.  Mother receptive to all teaching and plan of care.    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

## 2020-12-10 LAB — SURGICAL PATHOLOGY

## 2020-12-17 ENCOUNTER — Inpatient Hospital Stay (HOSPITAL_COMMUNITY): Admission: AD | Admit: 2020-12-17 | Payer: 59 | Source: Home / Self Care | Admitting: Obstetrics and Gynecology

## 2020-12-17 ENCOUNTER — Inpatient Hospital Stay (HOSPITAL_COMMUNITY): Payer: 59

## 2021-01-07 DIAGNOSIS — E039 Hypothyroidism, unspecified: Secondary | ICD-10-CM | POA: Diagnosis not present

## 2021-01-21 DIAGNOSIS — L905 Scar conditions and fibrosis of skin: Secondary | ICD-10-CM | POA: Diagnosis not present

## 2021-01-21 DIAGNOSIS — M62838 Other muscle spasm: Secondary | ICD-10-CM | POA: Diagnosis not present

## 2021-01-21 DIAGNOSIS — M6281 Muscle weakness (generalized): Secondary | ICD-10-CM | POA: Diagnosis not present

## 2021-02-04 DIAGNOSIS — E039 Hypothyroidism, unspecified: Secondary | ICD-10-CM | POA: Diagnosis not present

## 2021-02-06 DIAGNOSIS — M6281 Muscle weakness (generalized): Secondary | ICD-10-CM | POA: Diagnosis not present

## 2021-02-06 DIAGNOSIS — L905 Scar conditions and fibrosis of skin: Secondary | ICD-10-CM | POA: Diagnosis not present

## 2021-02-06 DIAGNOSIS — M62838 Other muscle spasm: Secondary | ICD-10-CM | POA: Diagnosis not present

## 2021-07-18 DIAGNOSIS — D2260 Melanocytic nevi of unspecified upper limb, including shoulder: Secondary | ICD-10-CM | POA: Diagnosis not present

## 2021-07-18 DIAGNOSIS — D1801 Hemangioma of skin and subcutaneous tissue: Secondary | ICD-10-CM | POA: Diagnosis not present

## 2021-07-18 DIAGNOSIS — L814 Other melanin hyperpigmentation: Secondary | ICD-10-CM | POA: Diagnosis not present

## 2021-07-18 DIAGNOSIS — L821 Other seborrheic keratosis: Secondary | ICD-10-CM | POA: Diagnosis not present

## 2021-07-27 ENCOUNTER — Encounter: Payer: Self-pay | Admitting: Physician Assistant

## 2021-08-30 ENCOUNTER — Encounter: Payer: Self-pay | Admitting: Physician Assistant

## 2021-09-02 ENCOUNTER — Ambulatory Visit: Payer: 59 | Admitting: Physician Assistant

## 2021-09-19 NOTE — Progress Notes (Signed)
? ?Complete physical exam ? ? ?Patient: Kimberly Gomez   DOB: Oct 25, 1985   36 y.o. Female  MRN: [id number] ?Visit Date: 09/20/2021 ? ?Chief Complaint  ?Patient presents with  ? Annual Exam  ?  Fasting CPE- no other concerns  ? ?Subjective  ?  ?Kimberly Gomez is a 36 y.o. female who presents today for a complete physical exam.  ? ?Reports is generally feeling well; is eating a regular diet; is sleeping well about 6 - 7 hours a night; drinks 60 - 90 ounces of water a day; is exercising regularly with weight training, HIIT training & walking; is weaning 48 month old from breastfeeding and asks what she can take for allergies.  ? ? ?HPI ?HPI   ? ? Annual Exam   ? Additional comments: Fasting CPE- no other concerns ? ?  ?  ?Last edited by Deforest Hoyles, [place] on 09/20/2021  8:25 AM.  ?  ?  ? ? ?Past Medical History:  ?Diagnosis Date  ? Gestational (pregnancy-induced) hypertension without significant proteinuria, third trimester 10/06/2017  ? Gestational hypertension w/o significant proteinuria in 3rd trimester 10/05/2017  ? Hypertensive disorder 10/15/2017  ? Hypothyroidism   ? PROM (premature rupture of membranes) 12/06/2020  ? ?Past Surgical History:  ?Procedure Laterality Date  ? IVF retrievals    ? ROTATOR CUFF REPAIR Left   ? WISDOM TOOTH EXTRACTION    ? ?Social History  ? ?Socioeconomic History  ? Marital status: Married  ?  Spouse name: Not on file  ? Number of children: Not on file  ? Years of education: Not on file  ? Highest education level: Not on file  ?Occupational History  ? Not on file  ?Tobacco Use  ? Smoking status: Never  ? Smokeless tobacco: Never  ?Vaping Use  ? Vaping Use: Never used  ?Substance and Sexual Activity  ? Alcohol use: No  ? Drug use: No  ? Sexual activity: Yes  ?  Partners: Male  ?  Birth control/protection: None  ?  Comment: pregnant   ?Other Topics Concern  ? Not on file  ?Social History Narrative  ? Not on file  ? ?Social Determinants of Health  ? ?Financial Resource Strain: Not on  file  ?Food Insecurity: Not on file  ?Transportation Needs: Not on file  ?Physical Activity: Not on file  ?Stress: Not on file  ?Social Connections: Not on file  ?Intimate Partner Violence: Not on file  ? ?Family Status  ?Relation Name Status  ? Mother  Alive  ? Father  Alive  ? Sister  Alive  ? Sister  Alive  ? Brother  Alive  ? Brother  Alive  ? MGM  Alive  ? MGF  Deceased  ? PGM  Alive  ? PGF  Deceased  ? Neg Hx  (Not Specified)  ? ?Family History  ?Problem Relation Age of Onset  ? Hyperlipidemia Father   ? Hypertension Father   ? Hyperlipidemia Maternal Grandmother   ? Heart failure Paternal Grandmother   ? Heart attack Paternal Grandfather   ? Breast cancer Neg Hx   ? Colon cancer Neg Hx   ? CVA Neg Hx   ? Diabetes type II Neg Hx   ? ?Allergies  ?Allergen Reactions  ? Amoxicillin   ?  Has patient had a PCN reaction causing immediate rash, facial/tongue/throat swelling, SOB or lightheadedness with hypotension: Yes ?Has patient had a PCN reaction causing severe rash involving mucus membranes or skin necrosis: Yes ?Has patient  had a PCN reaction that required hospitalization: no ?Has patient had a PCN reaction occurring within the last 10 years: No ?If all of the above answers are "NO", then may proceed with Cephalosporin use.  ? Penicillins   ?  ?Patient Care Team: ?Marcellina Millin as PCP - General (Physician Assistant)  ? ?Medications: ?Outpatient Medications Prior to Visit  ?Medication Sig  ? [DISCONTINUED] levothyroxine (SYNTHROID) 25 MCG tablet Take 25 mcg by mouth daily before breakfast. (Patient not taking: Reported on 09/20/2021)  ? [DISCONTINUED] Prenatal Vit-Fe Fumarate-FA (PRENATAL MULTIVITAMIN) TABS tablet Take 1 tablet by mouth daily at 12 noon. (Patient not taking: Reported on 09/20/2021)  ? ?No facility-administered medications prior to visit.  ? ? ?Review of Systems  ?Constitutional:  Negative for activity change and fever.  ?HENT:  Positive for postnasal drip, rhinorrhea and sneezing.  Negative for congestion, ear pain and voice change.   ?Eyes:  Negative for redness.  ?Respiratory:  Negative for cough.   ?Cardiovascular:  Negative for chest pain.  ?Gastrointestinal:  Negative for constipation and diarrhea.  ?Endocrine: Negative for polyuria.  ?Genitourinary:  Negative for flank pain.  ?Musculoskeletal:  Negative for gait problem and neck stiffness.  ?Skin:  Negative for color change and rash.  ?Neurological:  Negative for dizziness.  ?Hematological:  Negative for adenopathy.  ?Psychiatric/Behavioral:  Negative for agitation, behavioral problems and confusion.   ? ? ? ?The ASCVD Risk score (Arnett DK, et al., 2019) failed to calculate for the following reasons: ?  The 2019 ASCVD risk score is only valid for ages 86 to 63 ? ? Objective  ?  ?BP 120/80   Pulse 81   Wt 135 lb (61.2 kg)   LMP  (LMP Unknown)   SpO2 100%   Breastfeeding Yes   BMI 21.46 kg/m?  ? ? ? ? ?Physical Exam ?Vitals and nursing note reviewed.  ?Constitutional:   ?   General: She is not in acute distress. ?   Appearance: Normal appearance. She is not ill-appearing.  ?HENT:  ?   Head: Normocephalic and atraumatic.  ?   Right Ear: Tympanic membrane, ear canal and external ear normal.  ?   Left Ear: Tympanic membrane, ear canal and external ear normal.  ?   Nose: No congestion.  ?Eyes:  ?   Extraocular Movements: Extraocular movements intact.  ?   Conjunctiva/sclera: Conjunctivae normal.  ?   Pupils: Pupils are equal, round, and reactive to light.  ?Neck:  ?   Vascular: No carotid bruit.  ?Cardiovascular:  ?   Rate and Rhythm: Normal rate and regular rhythm.  ?   Pulses: Normal pulses.  ?   Heart sounds: Normal heart sounds.  ?Pulmonary:  ?   Effort: Pulmonary effort is normal.  ?   Breath sounds: Normal breath sounds. No wheezing.  ?Abdominal:  ?   General: Bowel sounds are normal.  ?   Palpations: Abdomen is soft.  ?Musculoskeletal:     ?   General: Normal range of motion.  ?   Cervical back: Normal range of motion and neck  supple.  ?   Right lower leg: No edema.  ?   Left lower leg: No edema.  ?Skin: ?   General: Skin is warm and dry.  ?   Findings: No bruising.  ?Neurological:  ?   General: No focal deficit present.  ?   Mental Status: She is alert and oriented to person, place, and time.  ?Psychiatric:     ?  Mood and Affect: Mood normal.     ?   Behavior: Behavior normal.     ?   Thought Content: Thought content normal.  ?  ? ? ?Last depression screening scores ? ?  09/20/2021  ?  8:26 AM 04/29/2018  ? 11:11 AM 04/20/2017  ?  8:51 AM  ?PHQ 2/9 Scores  ?PHQ - 2 Score 0 0 0  ? ?Last fall risk screening ? ?  09/20/2021  ?  8:26 AM  ?Fall Risk   ?Falls in the past year? 0  ?Number falls in past yr: 0  ?Injury with Fall? 0  ?Risk for fall due to : No Fall Risks  ?Follow up Falls evaluation completed  ? ? ? ?No results found for any visits on 09/20/21. ? Assessment & Plan  ?  ?Routine Health Maintenance and Physical Exam ? ?Exercise Activities and Dietary recommendations ? Goals   ?None ?  ? ? ?Immunization History  ?Administered Date(s) Administered  ? HPV Quadrivalent 12/22/2006, 03/24/2007, 12/22/2007  ? Hepatitis A 12/21/2005, 02/22/2008  ? Hepatitis A, Adult 12/21/2005, 02/22/2008  ? Hepatitis B 05/23/1996, 06/23/1996, 12/21/1996  ? Influenza-Unspecified 03/23/2014, 02/21/2018, 04/18/2019, 04/09/2020  ? MMR 06/24/1987, 05/23/1992  ? Tdap 02/22/2008, 08/16/2013, 08/20/2017, 09/17/2020  ? Varicella 09/21/2008  ? ? ?Health Maintenance  ?Topic Date Due  ? PAP SMEAR-Modifier  04/16/2022  ? TETANUS/TDAP  09/18/2030  ? HPV VACCINES  Completed  ? Hepatitis C Screening  Completed  ? HIV Screening  Completed  ? INFLUENZA VACCINE  Discontinued  ? ? ?Discussed health benefits of physical activity, and encouraged her to engage in regular exercise appropriate for her age and condition. ? ?Problem List Items Addressed This Visit   ? ?  ? Respiratory  ? Non-seasonal allergic rhinitis  ? ?Other Visit Diagnoses   ? ? Routine medical exam    -  Primary   ? Relevant Orders  ? CBC with Differential/Platelet  ? Comprehensive metabolic panel  ? Lipid panel  ? Thyroid Panel With TSH  ? ?  ?  ? ?Return in about 1 year (around 09/21/2022) for Return for Annual Exam.  ?  ? ?L

## 2021-09-20 ENCOUNTER — Ambulatory Visit (INDEPENDENT_AMBULATORY_CARE_PROVIDER_SITE_OTHER): Payer: 59 | Admitting: Physician Assistant

## 2021-09-20 ENCOUNTER — Encounter: Payer: Self-pay | Admitting: Physician Assistant

## 2021-09-20 VITALS — BP 120/80 | HR 81 | Ht 66.5 in | Wt 135.0 lb

## 2021-09-20 DIAGNOSIS — J3089 Other allergic rhinitis: Secondary | ICD-10-CM | POA: Diagnosis not present

## 2021-09-20 DIAGNOSIS — Z0000 Encounter for general adult medical examination without abnormal findings: Secondary | ICD-10-CM | POA: Diagnosis not present

## 2021-09-20 DIAGNOSIS — Z6821 Body mass index (BMI) 21.0-21.9, adult: Secondary | ICD-10-CM

## 2021-09-20 NOTE — Patient Instructions (Addendum)

## 2021-09-20 NOTE — Assessment & Plan Note (Signed)
Stable, may use OTC antihistamines, expectorants, nasal steroids and nasal saline rinse as needed ?

## 2021-09-20 NOTE — Assessment & Plan Note (Signed)
Stable, drink 8 - 10 glasses of water daily, limit sugar intake and eat a low fat diet, exercise 3 - 5 days a week, example walking 1 - 2 miles  ? ?

## 2021-09-21 LAB — CBC WITH DIFFERENTIAL/PLATELET
Basophils Absolute: 0.1 10*3/uL (ref 0.0–0.2)
Basos: 2 %
EOS (ABSOLUTE): 0.1 10*3/uL (ref 0.0–0.4)
Eos: 2 %
Hematocrit: 38.1 % (ref 34.0–46.6)
Hemoglobin: 12.7 g/dL (ref 11.1–15.9)
Immature Grans (Abs): 0 10*3/uL (ref 0.0–0.1)
Immature Granulocytes: 0 %
Lymphocytes Absolute: 1.9 10*3/uL (ref 0.7–3.1)
Lymphs: 39 %
MCH: 29 pg (ref 26.6–33.0)
MCHC: 33.3 g/dL (ref 31.5–35.7)
MCV: 87 fL (ref 79–97)
Monocytes Absolute: 0.5 10*3/uL (ref 0.1–0.9)
Monocytes: 11 %
Neutrophils Absolute: 2.2 10*3/uL (ref 1.4–7.0)
Neutrophils: 46 %
Platelets: 330 10*3/uL (ref 150–450)
RBC: 4.38 x10E6/uL (ref 3.77–5.28)
RDW: 12.4 % (ref 11.7–15.4)
WBC: 4.8 10*3/uL (ref 3.4–10.8)

## 2021-09-21 LAB — THYROID PANEL WITH TSH
Free Thyroxine Index: 1.9 (ref 1.2–4.9)
T3 Uptake Ratio: 28 % (ref 24–39)
T4, Total: 6.7 ug/dL (ref 4.5–12.0)
TSH: 1.72 u[IU]/mL (ref 0.450–4.500)

## 2021-09-21 LAB — COMPREHENSIVE METABOLIC PANEL
ALT: 15 IU/L (ref 0–32)
AST: 17 IU/L (ref 0–40)
Albumin/Globulin Ratio: 1.6 (ref 1.2–2.2)
Albumin: 4.6 g/dL (ref 3.8–4.8)
Alkaline Phosphatase: 88 IU/L (ref 44–121)
BUN/Creatinine Ratio: 19 (ref 9–23)
BUN: 16 mg/dL (ref 6–20)
Bilirubin Total: 1 mg/dL (ref 0.0–1.2)
CO2: 25 mmol/L (ref 20–29)
Calcium: 9.5 mg/dL (ref 8.7–10.2)
Chloride: 101 mmol/L (ref 96–106)
Creatinine, Ser: 0.84 mg/dL (ref 0.57–1.00)
Globulin, Total: 2.9 g/dL (ref 1.5–4.5)
Glucose: 98 mg/dL (ref 70–99)
Potassium: 4.7 mmol/L (ref 3.5–5.2)
Sodium: 141 mmol/L (ref 134–144)
Total Protein: 7.5 g/dL (ref 6.0–8.5)
eGFR: 93 mL/min/{1.73_m2} (ref >59)

## 2021-09-21 LAB — LIPID PANEL
Chol/HDL Ratio: 2.4 ratio (ref 0.0–4.4)
Cholesterol, Total: 144 mg/dL (ref 100–199)
HDL: 60 mg/dL (ref >39)
LDL Chol Calc (NIH): 74 mg/dL (ref 0–99)
Triglycerides: 46 mg/dL (ref 0–149)
VLDL Cholesterol Cal: 10 mg/dL (ref 5–40)

## 2021-11-05 DIAGNOSIS — E289 Ovarian dysfunction, unspecified: Secondary | ICD-10-CM | POA: Diagnosis not present

## 2021-11-08 DIAGNOSIS — H52223 Regular astigmatism, bilateral: Secondary | ICD-10-CM | POA: Diagnosis not present

## 2021-11-08 DIAGNOSIS — H5213 Myopia, bilateral: Secondary | ICD-10-CM | POA: Diagnosis not present

## 2021-11-14 DIAGNOSIS — N979 Female infertility, unspecified: Secondary | ICD-10-CM | POA: Diagnosis not present

## 2021-11-28 DIAGNOSIS — N979 Female infertility, unspecified: Secondary | ICD-10-CM | POA: Diagnosis not present

## 2021-12-05 ENCOUNTER — Other Ambulatory Visit (HOSPITAL_COMMUNITY): Payer: Self-pay

## 2021-12-05 MED ORDER — LIDOCAINE-PRILOCAINE 2.5-2.5 % EX CREA
1.0000 | TOPICAL_CREAM | CUTANEOUS | 2 refills | Status: DC
  Filled 2021-12-05: qty 30, 30d supply, fill #0

## 2021-12-05 MED ORDER — ESTRADIOL 2 MG PO TABS
2.0000 mg | ORAL_TABLET | Freq: Three times a day (TID) | ORAL | 2 refills | Status: DC
  Filled 2021-12-05: qty 105, 35d supply, fill #0
  Filled 2022-01-31: qty 105, 35d supply, fill #1
  Filled 2022-03-10: qty 105, 35d supply, fill #2

## 2021-12-05 MED ORDER — PROGESTERONE 50 MG/ML IM OIL
TOPICAL_OIL | Freq: Every day | INTRAMUSCULAR | 2 refills | Status: DC
  Filled 2021-12-05: qty 30, 30d supply, fill #0
  Filled 2022-01-31: qty 30, 30d supply, fill #1

## 2021-12-05 MED ORDER — "NEEDLE (DISP) 25G X 1-1/2"" MISC"
3 refills | Status: DC
  Filled 2021-12-05 (×3): qty 30, 30d supply, fill #0
  Filled 2022-02-03: qty 30, 30d supply, fill #1

## 2021-12-05 MED ORDER — "SYRINGE/NEEDLE (DISP) 23G X 1"" 3 ML MISC"
2 refills | Status: DC
  Filled 2021-12-05 (×2): qty 30, 30d supply, fill #0
  Filled 2022-02-04: qty 30, 30d supply, fill #1

## 2021-12-17 DIAGNOSIS — Z01419 Encounter for gynecological examination (general) (routine) without abnormal findings: Secondary | ICD-10-CM | POA: Diagnosis not present

## 2021-12-17 DIAGNOSIS — Z124 Encounter for screening for malignant neoplasm of cervix: Secondary | ICD-10-CM | POA: Diagnosis not present

## 2021-12-17 LAB — HM PAP SMEAR: HM Pap smear: NORMAL

## 2021-12-17 LAB — RESULTS CONSOLE HPV: CHL HPV: NEGATIVE

## 2021-12-20 ENCOUNTER — Encounter: Payer: Self-pay | Admitting: Internal Medicine

## 2021-12-25 LAB — HM PAP SMEAR: HPV 16/18/45 genotyping: NEGATIVE

## 2022-01-01 DIAGNOSIS — N979 Female infertility, unspecified: Secondary | ICD-10-CM | POA: Diagnosis not present

## 2022-01-08 DIAGNOSIS — N979 Female infertility, unspecified: Secondary | ICD-10-CM | POA: Diagnosis not present

## 2022-01-15 DIAGNOSIS — N979 Female infertility, unspecified: Secondary | ICD-10-CM | POA: Diagnosis not present

## 2022-01-22 DIAGNOSIS — N979 Female infertility, unspecified: Secondary | ICD-10-CM | POA: Diagnosis not present

## 2022-01-31 ENCOUNTER — Other Ambulatory Visit (HOSPITAL_COMMUNITY): Payer: Self-pay

## 2022-01-31 DIAGNOSIS — Z3200 Encounter for pregnancy test, result unknown: Secondary | ICD-10-CM | POA: Diagnosis not present

## 2022-02-03 ENCOUNTER — Other Ambulatory Visit (HOSPITAL_COMMUNITY): Payer: Self-pay

## 2022-02-03 DIAGNOSIS — Z3201 Encounter for pregnancy test, result positive: Secondary | ICD-10-CM | POA: Diagnosis not present

## 2022-02-04 ENCOUNTER — Other Ambulatory Visit (HOSPITAL_COMMUNITY): Payer: Self-pay

## 2022-02-19 DIAGNOSIS — O200 Threatened abortion: Secondary | ICD-10-CM | POA: Diagnosis not present

## 2022-03-10 ENCOUNTER — Other Ambulatory Visit (HOSPITAL_COMMUNITY): Payer: Self-pay

## 2022-03-11 DIAGNOSIS — O09819 Supervision of pregnancy resulting from assisted reproductive technology, unspecified trimester: Secondary | ICD-10-CM | POA: Diagnosis not present

## 2022-03-11 DIAGNOSIS — Z113 Encounter for screening for infections with a predominantly sexual mode of transmission: Secondary | ICD-10-CM | POA: Diagnosis not present

## 2022-03-11 DIAGNOSIS — Z3480 Encounter for supervision of other normal pregnancy, unspecified trimester: Secondary | ICD-10-CM | POA: Diagnosis not present

## 2022-03-11 DIAGNOSIS — Z8639 Personal history of other endocrine, nutritional and metabolic disease: Secondary | ICD-10-CM | POA: Diagnosis not present

## 2022-03-11 DIAGNOSIS — Z3A09 9 weeks gestation of pregnancy: Secondary | ICD-10-CM | POA: Diagnosis not present

## 2022-03-11 LAB — OB RESULTS CONSOLE GC/CHLAMYDIA: Neisseria Gonorrhea: NEGATIVE

## 2022-03-11 LAB — OB RESULTS CONSOLE HEPATITIS B SURFACE ANTIGEN: Hepatitis B Surface Ag: NEGATIVE

## 2022-03-11 LAB — OB RESULTS CONSOLE RUBELLA ANTIBODY, IGM: Rubella: IMMUNE

## 2022-03-11 LAB — OB RESULTS CONSOLE RPR: RPR: NONREACTIVE

## 2022-03-11 LAB — HM HIV SCREENING LAB: HM HIV Screening: NEGATIVE

## 2022-03-11 LAB — HEPATITIS C ANTIBODY: HCV Ab: NEGATIVE

## 2022-03-11 LAB — OB RESULTS CONSOLE HIV ANTIBODY (ROUTINE TESTING): HIV: NONREACTIVE

## 2022-03-11 LAB — OB RESULTS CONSOLE ANTIBODY SCREEN: Antibody Screen: NEGATIVE

## 2022-03-13 LAB — OB RESULTS CONSOLE GC/CHLAMYDIA: Chlamydia: NEGATIVE

## 2022-04-07 DIAGNOSIS — Z369 Encounter for antenatal screening, unspecified: Secondary | ICD-10-CM | POA: Diagnosis not present

## 2022-04-08 ENCOUNTER — Other Ambulatory Visit: Payer: Self-pay | Admitting: Obstetrics

## 2022-04-08 DIAGNOSIS — Z363 Encounter for antenatal screening for malformations: Secondary | ICD-10-CM

## 2022-04-09 ENCOUNTER — Encounter: Payer: Self-pay | Admitting: Internal Medicine

## 2022-05-06 ENCOUNTER — Encounter: Payer: Self-pay | Admitting: Internal Medicine

## 2022-05-06 DIAGNOSIS — Z369 Encounter for antenatal screening, unspecified: Secondary | ICD-10-CM | POA: Diagnosis not present

## 2022-05-08 ENCOUNTER — Encounter: Payer: Self-pay | Admitting: *Deleted

## 2022-05-19 ENCOUNTER — Ambulatory Visit: Payer: 59 | Admitting: *Deleted

## 2022-05-19 ENCOUNTER — Other Ambulatory Visit: Payer: Self-pay | Admitting: *Deleted

## 2022-05-19 ENCOUNTER — Ambulatory Visit: Payer: 59 | Attending: Obstetrics

## 2022-05-19 ENCOUNTER — Encounter: Payer: Self-pay | Admitting: *Deleted

## 2022-05-19 VITALS — BP 119/74 | HR 82

## 2022-05-19 DIAGNOSIS — O09813 Supervision of pregnancy resulting from assisted reproductive technology, third trimester: Secondary | ICD-10-CM

## 2022-05-19 DIAGNOSIS — O09522 Supervision of elderly multigravida, second trimester: Secondary | ICD-10-CM | POA: Diagnosis not present

## 2022-05-19 DIAGNOSIS — O09293 Supervision of pregnancy with other poor reproductive or obstetric history, third trimester: Secondary | ICD-10-CM

## 2022-05-19 DIAGNOSIS — Z3A19 19 weeks gestation of pregnancy: Secondary | ICD-10-CM

## 2022-05-19 DIAGNOSIS — Z3689 Encounter for other specified antenatal screening: Secondary | ICD-10-CM | POA: Insufficient documentation

## 2022-05-19 DIAGNOSIS — Z363 Encounter for antenatal screening for malformations: Secondary | ICD-10-CM | POA: Diagnosis not present

## 2022-05-19 DIAGNOSIS — O09299 Supervision of pregnancy with other poor reproductive or obstetric history, unspecified trimester: Secondary | ICD-10-CM

## 2022-06-03 DIAGNOSIS — Z369 Encounter for antenatal screening, unspecified: Secondary | ICD-10-CM | POA: Diagnosis not present

## 2022-06-12 DIAGNOSIS — O358XX0 Maternal care for other (suspected) fetal abnormality and damage, not applicable or unspecified: Secondary | ICD-10-CM | POA: Diagnosis not present

## 2022-06-12 DIAGNOSIS — Z3183 Encounter for assisted reproductive fertility procedure cycle: Secondary | ICD-10-CM | POA: Diagnosis not present

## 2022-06-23 NOTE — L&D Delivery Note (Signed)
Delivery Note At 11:11 PM a viable and healthy female was delivered via Vaginal, Spontaneous (Presentation: Left Occiput Anterior).  APGAR: 9, 9; weight pending .   Placenta status: Spontaneous, Intact.  Cord: 3 vessels   The patient pushed once and delivered the fetal head to crowning in the LOA presentation. The shoulders delivered easily and the infant was passed to the maternal abdomen and cried vigorously. Following a 1 minute delay, the cord was clamped and cut. The balloon of the foley was noted to be prolapsing through the urethra. Prior to placental delivery, the foley catheter was removed. The placenta then delivered spontaneously, intact with 3 vessel cord.No lacerations required repair. EBL 344 cc. All sponge, needle, and instrument counts were correct.  Anesthesia: Epidural Episiotomy: None Lacerations: None Suture Repair:  NA Est. Blood Loss (mL): 344  Mom to postpartum.  Baby to Couplet care / Skin to Skin.  Waynard Reeds 10/04/2022, 11:34 PM

## 2022-06-24 ENCOUNTER — Other Ambulatory Visit: Payer: Self-pay | Admitting: *Deleted

## 2022-06-24 ENCOUNTER — Encounter: Payer: Self-pay | Admitting: *Deleted

## 2022-06-24 ENCOUNTER — Ambulatory Visit: Payer: 59 | Admitting: *Deleted

## 2022-06-24 ENCOUNTER — Ambulatory Visit: Payer: 59 | Attending: Obstetrics

## 2022-06-24 DIAGNOSIS — O09812 Supervision of pregnancy resulting from assisted reproductive technology, second trimester: Secondary | ICD-10-CM | POA: Diagnosis not present

## 2022-06-24 DIAGNOSIS — O09522 Supervision of elderly multigravida, second trimester: Secondary | ICD-10-CM

## 2022-06-24 DIAGNOSIS — O09299 Supervision of pregnancy with other poor reproductive or obstetric history, unspecified trimester: Secondary | ICD-10-CM

## 2022-06-24 DIAGNOSIS — O09523 Supervision of elderly multigravida, third trimester: Secondary | ICD-10-CM

## 2022-06-24 DIAGNOSIS — Z3A24 24 weeks gestation of pregnancy: Secondary | ICD-10-CM | POA: Insufficient documentation

## 2022-06-24 DIAGNOSIS — Z362 Encounter for other antenatal screening follow-up: Secondary | ICD-10-CM | POA: Diagnosis not present

## 2022-06-24 DIAGNOSIS — O09292 Supervision of pregnancy with other poor reproductive or obstetric history, second trimester: Secondary | ICD-10-CM | POA: Insufficient documentation

## 2022-06-24 DIAGNOSIS — O09293 Supervision of pregnancy with other poor reproductive or obstetric history, third trimester: Secondary | ICD-10-CM

## 2022-06-24 DIAGNOSIS — O09819 Supervision of pregnancy resulting from assisted reproductive technology, unspecified trimester: Secondary | ICD-10-CM

## 2022-07-01 DIAGNOSIS — Z369 Encounter for antenatal screening, unspecified: Secondary | ICD-10-CM | POA: Diagnosis not present

## 2022-07-07 ENCOUNTER — Encounter: Payer: Self-pay | Admitting: Nurse Practitioner

## 2022-07-21 ENCOUNTER — Encounter: Payer: Self-pay | Admitting: Nurse Practitioner

## 2022-07-22 ENCOUNTER — Ambulatory Visit: Payer: 59 | Admitting: Family Medicine

## 2022-07-22 ENCOUNTER — Encounter: Payer: Self-pay | Admitting: Family Medicine

## 2022-07-22 VITALS — BP 126/84 | HR 80 | Temp 98.0°F | Resp 16 | Wt 152.8 lb

## 2022-07-22 DIAGNOSIS — L503 Dermatographic urticaria: Secondary | ICD-10-CM | POA: Diagnosis not present

## 2022-07-22 DIAGNOSIS — T7840XA Allergy, unspecified, initial encounter: Secondary | ICD-10-CM

## 2022-07-22 DIAGNOSIS — J301 Allergic rhinitis due to pollen: Secondary | ICD-10-CM

## 2022-07-22 NOTE — Progress Notes (Signed)
   Subjective:    Patient ID: Kimberly Gomez, female    DOB: [birth year], 37 y.o.   MRN: [id number]  HPI She is here for evaluation of difficulty with swelling of the eyes and hands on Sunday and complaint of itching but there is no rash present.  On Monday she then noted again difficulty with swelling of the hands and lips and in the past that had some swelling around the eyes.  She also complains of itching no new soaps, detergents, colognes.  She is [redacted] weeks pregnant.  She does have underlying seasonal allergies.  Presently she is not having any itching.  Last night she did take a Benadryl.   Review of Systems     Objective:   Physical Exam Alert and in no distress. Tympanic membranes and canals are normal. Pharyngeal area is normal. Neck is supple without adenopathy or thyromegaly.  Exam of the skin shows no lesions or edema dermatographia is present.      Assessment & Plan:  Allergic reaction, initial encounter  Seasonal allergic rhinitis due to pollen  Dermatographia I explained that at this point I see no cause for this but recommend Claritin/or Allegra regularly for the next week and Benadryl as she needs it.  She was comfortable with that.

## 2022-07-23 DIAGNOSIS — E039 Hypothyroidism, unspecified: Secondary | ICD-10-CM | POA: Diagnosis not present

## 2022-07-23 DIAGNOSIS — Z3480 Encounter for supervision of other normal pregnancy, unspecified trimester: Secondary | ICD-10-CM | POA: Diagnosis not present

## 2022-07-23 DIAGNOSIS — Z23 Encounter for immunization: Secondary | ICD-10-CM | POA: Diagnosis not present

## 2022-07-31 ENCOUNTER — Encounter: Payer: Self-pay | Admitting: Nurse Practitioner

## 2022-08-01 DIAGNOSIS — Z369 Encounter for antenatal screening, unspecified: Secondary | ICD-10-CM | POA: Diagnosis not present

## 2022-08-19 ENCOUNTER — Ambulatory Visit: Payer: 59

## 2022-08-21 DIAGNOSIS — Z3A32 32 weeks gestation of pregnancy: Secondary | ICD-10-CM | POA: Diagnosis not present

## 2022-08-21 DIAGNOSIS — O09513 Supervision of elderly primigravida, third trimester: Secondary | ICD-10-CM | POA: Diagnosis not present

## 2022-09-02 ENCOUNTER — Encounter: Payer: Self-pay | Admitting: Nurse Practitioner

## 2022-09-02 DIAGNOSIS — Z369 Encounter for antenatal screening, unspecified: Secondary | ICD-10-CM | POA: Diagnosis not present

## 2022-09-11 ENCOUNTER — Encounter: Payer: Self-pay | Admitting: Nurse Practitioner

## 2022-09-16 DIAGNOSIS — Z3480 Encounter for supervision of other normal pregnancy, unspecified trimester: Secondary | ICD-10-CM | POA: Diagnosis not present

## 2022-09-16 DIAGNOSIS — Z3A36 36 weeks gestation of pregnancy: Secondary | ICD-10-CM | POA: Diagnosis not present

## 2022-09-16 DIAGNOSIS — O09513 Supervision of elderly primigravida, third trimester: Secondary | ICD-10-CM | POA: Diagnosis not present

## 2022-09-16 DIAGNOSIS — Z369 Encounter for antenatal screening, unspecified: Secondary | ICD-10-CM | POA: Diagnosis not present

## 2022-09-16 LAB — OB RESULTS CONSOLE GBS: GBS: POSITIVE

## 2022-09-23 ENCOUNTER — Encounter: Payer: Self-pay | Admitting: Internal Medicine

## 2022-09-26 DIAGNOSIS — Z3A38 38 weeks gestation of pregnancy: Secondary | ICD-10-CM | POA: Diagnosis not present

## 2022-09-26 DIAGNOSIS — O09819 Supervision of pregnancy resulting from assisted reproductive technology, unspecified trimester: Secondary | ICD-10-CM | POA: Diagnosis not present

## 2022-09-30 ENCOUNTER — Encounter: Payer: 59 | Admitting: Physician Assistant

## 2022-09-30 DIAGNOSIS — O09819 Supervision of pregnancy resulting from assisted reproductive technology, unspecified trimester: Secondary | ICD-10-CM | POA: Diagnosis not present

## 2022-09-30 DIAGNOSIS — Z3A38 38 weeks gestation of pregnancy: Secondary | ICD-10-CM | POA: Diagnosis not present

## 2022-10-02 ENCOUNTER — Encounter (HOSPITAL_COMMUNITY): Payer: Self-pay | Admitting: *Deleted

## 2022-10-02 ENCOUNTER — Telehealth (HOSPITAL_COMMUNITY): Payer: Self-pay | Admitting: *Deleted

## 2022-10-02 ENCOUNTER — Other Ambulatory Visit: Payer: Self-pay | Admitting: Obstetrics and Gynecology

## 2022-10-02 DIAGNOSIS — Z3490 Encounter for supervision of normal pregnancy, unspecified, unspecified trimester: Secondary | ICD-10-CM

## 2022-10-02 NOTE — Telephone Encounter (Signed)
Preadmission screen  

## 2022-10-04 ENCOUNTER — Inpatient Hospital Stay (HOSPITAL_COMMUNITY): Payer: 59

## 2022-10-04 ENCOUNTER — Encounter (HOSPITAL_COMMUNITY): Payer: Self-pay | Admitting: Obstetrics and Gynecology

## 2022-10-04 ENCOUNTER — Inpatient Hospital Stay (HOSPITAL_COMMUNITY): Payer: 59 | Admitting: Anesthesiology

## 2022-10-04 ENCOUNTER — Inpatient Hospital Stay (HOSPITAL_COMMUNITY)
Admission: RE | Admit: 2022-10-04 | Discharge: 2022-10-06 | DRG: 807 | Disposition: A | Discharge status: Home / Self Care | Payer: 59 | Attending: Obstetrics and Gynecology | Admitting: Obstetrics and Gynecology

## 2022-10-04 ENCOUNTER — Other Ambulatory Visit: Payer: Self-pay

## 2022-10-04 DIAGNOSIS — E039 Hypothyroidism, unspecified: Secondary | ICD-10-CM | POA: Diagnosis not present

## 2022-10-04 DIAGNOSIS — Z3A39 39 weeks gestation of pregnancy: Secondary | ICD-10-CM

## 2022-10-04 DIAGNOSIS — Z880 Allergy status to penicillin: Secondary | ICD-10-CM

## 2022-10-04 DIAGNOSIS — Z3490 Encounter for supervision of normal pregnancy, unspecified, unspecified trimester: Principal | ICD-10-CM

## 2022-10-04 DIAGNOSIS — O99824 Streptococcus B carrier state complicating childbirth: Secondary | ICD-10-CM | POA: Diagnosis not present

## 2022-10-04 DIAGNOSIS — O26893 Other specified pregnancy related conditions, third trimester: Principal | ICD-10-CM | POA: Diagnosis present

## 2022-10-04 DIAGNOSIS — O99284 Endocrine, nutritional and metabolic diseases complicating childbirth: Secondary | ICD-10-CM | POA: Diagnosis not present

## 2022-10-04 LAB — BASIC METABOLIC PANEL
Anion gap: 13 (ref 5–15)
BUN: 10 mg/dL (ref 6–20)
CO2: 20 mmol/L — ABNORMAL LOW (ref 22–32)
Calcium: 9 mg/dL (ref 8.9–10.3)
Chloride: 104 mmol/L (ref 98–111)
Creatinine, Ser: 0.64 mg/dL (ref 0.44–1.00)
GFR, Estimated: >60 mL/min (ref >60)
Glucose, Bld: 68 mg/dL — ABNORMAL LOW (ref 70–99)
Potassium: 3.8 mmol/L (ref 3.5–5.1)
Sodium: 137 mmol/L (ref 135–145)

## 2022-10-04 LAB — CBC
HCT: 38.5 % (ref 36.0–46.0)
Hemoglobin: 13.8 g/dL (ref 12.0–15.0)
MCH: 32.8 pg (ref 26.0–34.0)
MCHC: 35.8 g/dL (ref 30.0–36.0)
MCV: 91.4 fL (ref 80.0–100.0)
Platelets: 233 10*3/uL (ref 150–400)
RBC: 4.21 MIL/uL (ref 3.87–5.11)
RDW: 12.3 % (ref 11.5–15.5)
WBC: 9.6 10*3/uL (ref 4.0–10.5)
nRBC: 0 % (ref 0.0–0.2)

## 2022-10-04 LAB — TYPE AND SCREEN
ABO/RH(D): A POS
Antibody Screen: NEGATIVE

## 2022-10-04 MED ORDER — SOD CITRATE-CITRIC ACID 500-334 MG/5ML PO SOLN: 30.0000 mL | ORAL | Status: DC | PRN

## 2022-10-04 MED ORDER — LIDOCAINE HCL (PF) 1 % IJ SOLN
INTRAMUSCULAR | Status: DC | PRN
  Administered 2022-10-04: 3 mL via EPIDURAL
  Administered 2022-10-04: 5 mL via EPIDURAL

## 2022-10-04 MED ORDER — OXYTOCIN-SODIUM CHLORIDE 30-0.9 UT/500ML-% IV SOLN
2.5000 [IU]/h | INTRAVENOUS | Status: DC
  Administered 2022-10-04: 2.5 [IU]/h via INTRAVENOUS

## 2022-10-04 MED ORDER — FENTANYL CITRATE (PF) 100 MCG/2ML IJ SOLN: 50.0000 ug | INTRAMUSCULAR | Status: DC | PRN

## 2022-10-04 MED ORDER — ACETAMINOPHEN 325 MG PO TABS: 650.0000 mg | ORAL_TABLET | ORAL | Status: DC | PRN

## 2022-10-04 MED ORDER — OXYTOCIN-SODIUM CHLORIDE 30-0.9 UT/500ML-% IV SOLN
1.0000 m[IU]/min | INTRAVENOUS | Status: DC
  Administered 2022-10-04: 2 m[IU]/min via INTRAVENOUS
  Filled 2022-10-04: qty 500

## 2022-10-04 MED ORDER — LACTATED RINGERS IV SOLN: 500.0000 mL | Freq: Once | INTRAVENOUS | Status: AC

## 2022-10-04 MED ORDER — ONDANSETRON HCL 4 MG/2ML IJ SOLN: 4.0000 mg | Freq: Four times a day (QID) | INTRAMUSCULAR | Status: DC | PRN

## 2022-10-04 MED ORDER — PHENYLEPHRINE 80 MCG/ML (10ML) SYRINGE FOR IV PUSH (FOR BLOOD PRESSURE SUPPORT): 80.0000 ug | PREFILLED_SYRINGE | INTRAVENOUS | Status: DC | PRN

## 2022-10-04 MED ORDER — EPHEDRINE 5 MG/ML INJ: 10.0000 mg | INTRAVENOUS | Status: DC | PRN

## 2022-10-04 MED ORDER — LACTATED RINGERS IV SOLN: INTRAVENOUS | Status: DC

## 2022-10-04 MED ORDER — OXYTOCIN BOLUS FROM INFUSION
333.0000 mL | Freq: Once | INTRAVENOUS | Status: AC
  Administered 2022-10-04: 333 mL via INTRAVENOUS

## 2022-10-04 MED ORDER — FENTANYL-BUPIVACAINE-NACL 0.5-0.125-0.9 MG/250ML-% EP SOLN
12.0000 mL/h | EPIDURAL | Status: DC | PRN
  Administered 2022-10-04: 12 mL/h via EPIDURAL
  Filled 2022-10-04: qty 250

## 2022-10-04 MED ORDER — LACTATED RINGERS IV SOLN
500.0000 mL | INTRAVENOUS | Status: DC | PRN
  Administered 2022-10-04: 1000 mL via INTRAVENOUS

## 2022-10-04 MED ORDER — VANCOMYCIN HCL 1.5 G IV SOLR
1500.0000 mg | Freq: Three times a day (TID) | INTRAVENOUS | Status: DC
  Administered 2022-10-04 (×2): 1500 mg via INTRAVENOUS
  Filled 2022-10-04 (×4): qty 30

## 2022-10-04 MED ORDER — OXYCODONE-ACETAMINOPHEN 5-325 MG PO TABS: 1.0000 | ORAL_TABLET | ORAL | Status: DC | PRN

## 2022-10-04 MED ORDER — LIDOCAINE HCL (PF) 1 % IJ SOLN: 30.0000 mL | INTRAMUSCULAR | Status: DC | PRN

## 2022-10-04 MED ORDER — TERBUTALINE SULFATE 1 MG/ML IJ SOLN: 0.2500 mg | Freq: Once | INTRAMUSCULAR | Status: DC | PRN

## 2022-10-04 MED ORDER — OXYCODONE-ACETAMINOPHEN 5-325 MG PO TABS: 2.0000 | ORAL_TABLET | ORAL | Status: DC | PRN

## 2022-10-04 MED ORDER — DIPHENHYDRAMINE HCL 50 MG/ML IJ SOLN: 12.5000 mg | INTRAMUSCULAR | Status: DC | PRN

## 2022-10-04 NOTE — Anesthesia Procedure Notes (Signed)
Epidural Patient location during procedure: OB Start time: 10/04/2022 3:10 PM End time: 10/04/2022 3:15 PM  Staffing Anesthesiologist: Linton Rump, MD Performed: anesthesiologist   Preanesthetic Checklist Completed: patient identified, IV checked, site marked, risks and benefits discussed, surgical consent, monitors and equipment checked, pre-op evaluation and timeout performed  Epidural Patient position: sitting Prep: DuraPrep and site prepped and draped Patient monitoring: continuous pulse ox and blood pressure Approach: midline Location: L3-L4 Injection technique: LOR saline  Needle:  Needle type: Tuohy  Needle gauge: 17 G Needle length: 9 cm and 9 Needle insertion depth: 6 cm Catheter type: closed end flexible Catheter size: 19 Gauge Catheter at skin depth: 10 cm Test dose: negative  Assessment Events: blood not aspirated, no cerebrospinal fluid, injection not painful, no injection resistance, no paresthesia and negative IV test  Additional Notes The patient has requested an epidural for labor pain management. Risks and benefits including, but not limited to, infection, bleeding, local anesthetic toxicity, headache, hypotension, back pain, block failure, etc. were discussed with the patient. The patient expressed understanding and consented to the procedure. I confirmed that the patient has no bleeding disorders and is not taking blood thinners. I confirmed the patient's last platelet count with the nurse. A time-out was performed immediately prior to the procedure. Please see nursing documentation for vital signs. Sterile technique was used throughout the whole procedure. Once LOR achieved, the epidural catheter threaded easily without resistance. Aspiration of the catheter was negative for blood and CSF. The epidural was dosed slowly and an infusion was started.  1 attempt(s)Reason for block:procedure for pain

## 2022-10-04 NOTE — Consult Note (Signed)
ANTIBIOTIC CONSULT NOTE - INITIAL  Pharmacy Consult for Vancomycin Indication: GBS prophylaxis  Allergies  Allergen Reactions   Amoxicillin     Has patient had a PCN reaction causing immediate rash, facial/tongue/throat swelling, SOB or lightheadedness with hypotension: Yes Has patient had a PCN reaction causing severe rash involving mucus membranes or skin necrosis: Yes Has patient had a PCN reaction that required hospitalization: no Has patient had a PCN reaction occurring within the last 10 years: No If all of the above answers are "NO", then may proceed with Cephalosporin use.   Penicillins     Patient Measurements: Height: 5' 6.5" (168.9 cm) Weight: 72.2 kg (159 lb 3.2 oz) IBW/kg (Calculated) : 60.45 Adjusted Body Weight: 60.45  Vital Signs: Temp: 98.6 F (37 C) (04/13 1045) Temp Source: Oral (04/13 1045) BP: 134/91 (04/13 1133) Pulse Rate: 76 (04/13 1133)  Labs: Recent Labs    10/04/22 1118  WBC 9.6  HGB 13.8  PLT 233  CREATININE 0.64   No results for input(s): "VANCOTROUGH", "VANCOPEAK", "VANCORANDOM", "GENTTROUGH", "GENTPEAK", "GENTRANDOM" in the last 72 hours.   Microbiology: Recent Results (from the past 720 hour(s))  OB RESULT CONSOLE Group B Strep     Status: None   Collection Time: 09/16/22 12:00 AM  Result Value Ref Range Status   GBS Positive  Final    Medications:  Vancomycin  Assessment: 37 y.o. female [id number] at [redacted]w[redacted]d presents for induction of labor   Goal of Therapy:  Vancomycin Trough 10-15 mg/L  Plan:  Vancomycin 1500 mg IV every 8 hrs  Will check vancomycin trough at steady-state around the 5th dose or when clinically indicated.  Verneita Griffes PGY2 Pediatric Pharmacy Resident 10/04/2022 12:32 PM

## 2022-10-04 NOTE — Anesthesia Preprocedure Evaluation (Addendum)
Anesthesia Evaluation  Patient identified by MRN, date of birth, ID band Patient awake    Reviewed: Allergy & Precautions, NPO status , Patient's Chart, lab work & pertinent test results  History of Anesthesia Complications Negative for: history of anesthetic complications  Airway Mallampati: III  TM Distance: >3 FB Neck ROM: Full    Dental   Pulmonary neg pulmonary ROS   breath sounds clear to auscultation       Cardiovascular negative cardio ROS  Rhythm:Regular Rate:Normal     Neuro/Psych negative neurological ROS     GI/Hepatic Neg liver ROS,GERD  ,,  Endo/Other  Hypothyroidism    Renal/GU negative Renal ROS     Musculoskeletal   Abdominal   Peds  Hematology negative hematology ROS (+)   Anesthesia Other Findings Takes aspirin 81 mg. Last dose 10/03/2022.  Reproductive/Obstetrics (+) Pregnancy                              Anesthesia Physical Anesthesia Plan  ASA: 2  Anesthesia Plan: Epidural   Post-op Pain Management:    Induction:   PONV Risk Score and Plan:   Airway Management Planned:   Additional Equipment:   Intra-op Plan:   Post-operative Plan:   Informed Consent: I have reviewed the patients History and Physical, chart, labs and discussed the procedure including the risks, benefits and alternatives for the proposed anesthesia with the patient or authorized representative who has indicated his/her understanding and acceptance.       Plan Discussed with: Anesthesiologist  Anesthesia Plan Comments: (I have discussed risks of neuraxial anesthesia including but not limited to infection, bleeding, nerve injury, back pain, headache, seizures, and failure of block. Patient denies bleeding disorders and is not currently anticoagulated. Labs have been reviewed. Risks and benefits discussed. All patient's questions answered.  )        Anesthesia Quick  Evaluation

## 2022-10-04 NOTE — H&P (Signed)
Kimberly Gomez is a 37 y.o. female presenting for IOL for IVF pregnancy.  37 year old [id number] at 39+0 presents for induction of labor for IVF pregnancy.  Patient has a history of subclinical hypothyroidism.  She received levothyroxine treatment during egg retrieval process.  During the pregnancy her TSH and T4 have remained normal therefore she has not been on any thyroid replacement.  She has a history of preeclampsia with her prior pregnancy requiring a preterm delivery.  She has been on ASA 81 mg during this pregnancy.  She is AMA and declined genetic screening.  She had a detailed anatomic survey which was normal and a fetal echo which was also normal during this pregnancy.  Her GBS is positive she has a serious allergic reaction to beta-lactam's.  The GBS is resistant to clindamycin therefore we will plan vancomycin for GBS prophylaxis. OB History     Gravida  4   Para  2   Term  1   Preterm  1   AB  1   Living  2      SAB  1   IAB      Ectopic      Multiple  0   Live Births  2          Past Medical History:  Diagnosis Date   Gestational (pregnancy-induced) hypertension without significant proteinuria, third trimester 10/06/2017   Gestational hypertension w/o significant proteinuria in 3rd trimester 10/05/2017   History of pre-eclampsia in prior pregnancy, currently pregnant    Hypertensive disorder 10/15/2017   Hypothyroidism    PROM (premature rupture of membranes) 12/06/2020   Past Surgical History:  Procedure Laterality Date   IVF retrievals     ROTATOR CUFF REPAIR Left    WISDOM TOOTH EXTRACTION     Family History: family history includes Heart attack in her paternal grandfather; Heart failure in her paternal grandmother; Hyperlipidemia in her father and maternal grandmother; Hypertension in her father. Social History:  reports that she has never smoked. She has never used smokeless tobacco. She reports that she does not drink alcohol and does not use  drugs.     Maternal Diabetes: No Genetic Screening: Declined Maternal Ultrasounds/Referrals: Normal Fetal Ultrasounds or other Referrals:  Fetal echo Maternal Substance Abuse:  No Significant Maternal Medications:  None Significant Maternal Lab Results:  Group B Strep positive Number of Prenatal Visits:greater than 3 verified prenatal visits Other Comments:  None  Review of Systems History   Blood pressure (!) 126/93, pulse 86, temperature 98.6 F (37 C), temperature source Oral, resp. rate 17, height 5' 6.5" (1.689 m), weight 72.2 kg, last menstrual period 11/21/2021, currently breastfeeding. Exam Physical Exam   AOx3, NAD Abd gravid FHR 145 reactive, cat 1 tracing  Prenatal labs: ABO, Rh:  A pos Antibody: Negative (09/19 0000) Rubella: Immune (09/19 0000) RPR: Nonreactive (09/19 0000)  HBsAg: Negative (09/19 0000)  HIV: Non-reactive (09/19 0000)  GBS: Positive/-- (03/26 0000)   Assessment/Plan: 1) admit 2) Pitocin augmentation.  Plan amniotomy when able following administration of antibiotics for group B strep. 3) epidural on request   Waynard Reeds 10/04/2022, 11:19 AM

## 2022-10-05 ENCOUNTER — Encounter (HOSPITAL_COMMUNITY): Payer: Self-pay | Admitting: Obstetrics and Gynecology

## 2022-10-05 LAB — CBC
HCT: 31.8 % — ABNORMAL LOW (ref 36.0–46.0)
Hemoglobin: 11.3 g/dL — ABNORMAL LOW (ref 12.0–15.0)
MCH: 32.9 pg (ref 26.0–34.0)
MCHC: 35.5 g/dL (ref 30.0–36.0)
MCV: 92.7 fL (ref 80.0–100.0)
Platelets: 195 K/uL (ref 150–400)
RBC: 3.43 MIL/uL — ABNORMAL LOW (ref 3.87–5.11)
RDW: 12.3 % (ref 11.5–15.5)
WBC: 15.5 K/uL — ABNORMAL HIGH (ref 4.0–10.5)
nRBC: 0 % (ref 0.0–0.2)

## 2022-10-05 LAB — RPR: RPR Ser Ql: NONREACTIVE

## 2022-10-05 MED ORDER — METHYLERGONOVINE MALEATE 0.2 MG PO TABS: 0.2000 mg | ORAL_TABLET | ORAL | Status: DC | PRN

## 2022-10-05 MED ORDER — ZOLPIDEM TARTRATE 5 MG PO TABS: 5.0000 mg | ORAL_TABLET | Freq: Every evening | ORAL | Status: DC | PRN

## 2022-10-05 MED ORDER — SIMETHICONE 80 MG PO CHEW: 80.0000 mg | CHEWABLE_TABLET | ORAL | Status: DC | PRN

## 2022-10-05 MED ORDER — OXYCODONE HCL 5 MG PO TABS
10.0000 mg | ORAL_TABLET | ORAL | Status: DC | PRN
  Administered 2022-10-05 – 2022-10-06 (×2): 10 mg via ORAL
  Filled 2022-10-05 (×2): qty 2

## 2022-10-05 MED ORDER — ONDANSETRON HCL 4 MG/2ML IJ SOLN: 4.0000 mg | INTRAMUSCULAR | Status: DC | PRN

## 2022-10-05 MED ORDER — ONDANSETRON HCL 4 MG PO TABS: 4.0000 mg | ORAL_TABLET | ORAL | Status: DC | PRN

## 2022-10-05 MED ORDER — OXYCODONE HCL 5 MG PO TABS
5.0000 mg | ORAL_TABLET | ORAL | Status: DC | PRN
  Administered 2022-10-05: 5 mg via ORAL
  Filled 2022-10-05: qty 1

## 2022-10-05 MED ORDER — TETANUS-DIPHTH-ACELL PERTUSSIS 5-2.5-18.5 LF-MCG/0.5 IM SUSY: 0.5000 mL | PREFILLED_SYRINGE | Freq: Once | INTRAMUSCULAR | Status: DC

## 2022-10-05 MED ORDER — DIPHENHYDRAMINE HCL 25 MG PO CAPS: 25.0000 mg | ORAL_CAPSULE | Freq: Four times a day (QID) | ORAL | Status: DC | PRN

## 2022-10-05 MED ORDER — COCONUT OIL OIL: 1.0000 | TOPICAL_OIL | Status: DC | PRN

## 2022-10-05 MED ORDER — BENZOCAINE-MENTHOL 20-0.5 % EX AERO: 1.0000 | INHALATION_SPRAY | CUTANEOUS | Status: DC | PRN

## 2022-10-05 MED ORDER — ACETAMINOPHEN 325 MG PO TABS
650.0000 mg | ORAL_TABLET | ORAL | Status: DC | PRN
  Administered 2022-10-05 – 2022-10-06 (×4): 650 mg via ORAL
  Filled 2022-10-05 (×4): qty 2

## 2022-10-05 MED ORDER — METHYLERGONOVINE MALEATE 0.2 MG/ML IJ SOLN: 0.2000 mg | INTRAMUSCULAR | Status: DC | PRN

## 2022-10-05 MED ORDER — PRENATAL MULTIVITAMIN CH
1.0000 | ORAL_TABLET | Freq: Every day | ORAL | Status: DC
  Administered 2022-10-05 – 2022-10-06 (×2): 1 via ORAL
  Filled 2022-10-05 (×2): qty 1

## 2022-10-05 MED ORDER — IBUPROFEN 600 MG PO TABS
600.0000 mg | ORAL_TABLET | Freq: Four times a day (QID) | ORAL | Status: DC
  Administered 2022-10-05 – 2022-10-06 (×5): 600 mg via ORAL
  Filled 2022-10-05 (×6): qty 1

## 2022-10-05 MED ORDER — DIBUCAINE (PERIANAL) 1 % EX OINT: 1.0000 | TOPICAL_OINTMENT | CUTANEOUS | Status: DC | PRN

## 2022-10-05 MED ORDER — WITCH HAZEL-GLYCERIN EX PADS: 1.0000 | MEDICATED_PAD | CUTANEOUS | Status: DC | PRN

## 2022-10-05 MED ORDER — SENNOSIDES-DOCUSATE SODIUM 8.6-50 MG PO TABS
2.0000 | ORAL_TABLET | Freq: Every day | ORAL | Status: DC
  Administered 2022-10-05 – 2022-10-06 (×2): 2 via ORAL
  Filled 2022-10-05 (×2): qty 2

## 2022-10-05 MED ORDER — OXYTOCIN-SODIUM CHLORIDE 30-0.9 UT/500ML-% IV SOLN: 2.5000 [IU]/h | INTRAVENOUS | Status: DC | PRN

## 2022-10-05 MED ORDER — LACTATED RINGERS IV SOLN: INTRAVENOUS | Status: DC

## 2022-10-05 NOTE — Anesthesia Postprocedure Evaluation (Signed)
Anesthesia Post Note  Patient: Kimberly Gomez  Procedure(s) Performed: AN AD HOC LABOR EPIDURAL     Patient location during evaluation: Mother Baby Anesthesia Type: Epidural Level of consciousness: awake and alert and oriented Pain management: satisfactory to patient Vital Signs Assessment: post-procedure vital signs reviewed and stable Respiratory status: respiratory function stable Cardiovascular status: stable Postop Assessment: no headache, no backache, epidural receding, patient able to bend at knees, no signs of nausea or vomiting, adequate PO intake and able to ambulate Anesthetic complications: no   No notable events documented.  Last Vitals:  Vitals:   10/05/22 0249 10/05/22 0530  BP: 128/84 108/78  Pulse: 69 82  Resp: 18 20  Temp: 36.6 C 36.7 C  SpO2: 99% 99%    Last Pain:  Vitals:   10/05/22 0650  TempSrc:   PainSc: 0-No pain   Pain Goal:                   Yasamin Karel

## 2022-10-05 NOTE — Plan of Care (Signed)

## 2022-10-05 NOTE — Progress Notes (Signed)
Post Partum Day 1 Subjective: no complaints, up ad lib, voiding, and tolerating PO  Objective: Blood pressure 111/73, pulse 71, temperature 98.9 F (37.2 C), temperature source Oral, resp. rate 18, height 5' 6.5" (1.689 m), weight 72.2 kg, last menstrual period 11/21/2021, SpO2 99 %, unknown if currently breastfeeding.  Physical Exam:  General: alert, cooperative, and appears stated age Lochia: appropriate Uterine Fundus: firm Incision: NA DVT Evaluation: No evidence of DVT seen on physical exam.  Recent Labs    10/04/22 1118 10/05/22 0437  HGB 13.8 11.3*  HCT 38.5 31.8*    Assessment/Plan: Plan for discharge tomorrow Breastfeeding Desires neonatal circumcision, R/B/A of procedure discussed at length. Pt understands that neonatal circumcision is not considered medically necessary and is elective. The risks include, but are not limited to bleeding, infection, damage to the penis, development of scar tissue, and having to have it redone at a later date. Pt understands theses risks and wishes to proceed    LOS: 1 day   Waynard Reeds, MD 10/05/2022, 10:58 AM

## 2022-10-05 NOTE — Lactation Note (Signed)
This note was copied from a baby's chart. Lactation Consultation Note  Patient Name: Boy Lakai Trevillion [id number] Date: 10/05/2022 Age:37 hours Reason for consult: Follow-up assessment;Term;Breastfeeding assistance  The infant was at 10 hours old.  LC entered the room at the request of the birth parent.  The birth parent stated that the infant was ready to feed.  The birth parent attempted to put the infant to the breast, but he was off and on.  LC assisted the birth parent with spoon feeding the infant 62mL of colostrum.  LC assessed the infant's mouth per the request of the birth parent.  The infant may have a tight labial frenulum and a short posterior frenulum.  LC helped the birth parent to put the infant to the right breast in the cross-cradle position.  LC used the teacup hold to help the infant get suction on the breast.  The infant had trouble sucking initially and held the nipple in his mouth.  The infant latched with his tongue down, sucking became more rhythmic, and some jaw extensions were noted.  The birth parent stated that the latch was not painful.  The parents will speak with the pediatrician and the pediatric dentist about the infant's oral cavity if they have any further concerns.  All questions were answered.    Maternal Data Has patient been taught Hand Expression?: Yes Does the patient have breastfeeding experience prior to this delivery?: Yes How long did the patient breastfeed?: 10 months  Feeding Mother's Current Feeding Choice: Breast Milk and Formula  LATCH Score Latch: Repeated attempts needed to sustain latch, nipple held in mouth throughout feeding, stimulation needed to elicit sucking reflex.  Audible Swallowing: A few with stimulation  Type of Nipple: Everted at rest and after stimulation  Comfort (Breast/Nipple): Soft / non-tender  Hold (Positioning): Assistance needed to correctly position infant at breast and maintain latch.  LATCH Score:  7   Lactation Tools Discussed/Used    Interventions Interventions: Assisted with latch;Adjust position  Discharge Pump: DEBP;Personal  Consult Status Consult Status: Follow-up Date: 10/06/22 Follow-up type: In-patient    Orvil Feil Tadao Emig 10/05/2022, 10:15 AM

## 2022-10-05 NOTE — Lactation Note (Signed)
This note was copied from a baby's chart. Lactation Consultation Note  Patient Name: Kimberly Gomez [id number] Date: 10/05/2022 Age:37 hours Reason for consult: Initial assessment;Term;Breastfeeding assistance;Maternal endocrine disorder  Infant is at 27 hour old.  LC entered the room and the infant was asleep in the bassinet.  The birth parent said that things are going "okay" with breastfeeding.  She does not feel like the infant has a good latch.  The birth parent stated that her middle child had a tongue tie and it was missed for about 1 month.  She wants to have the infant's oral cavity assessed at the next feeding.  LC encouraged the birth parent to call for lactation assistance when she is ready to feed the infant.  LC reviewed the outpatient brochure, wrote her name on the board, congratulated the parents, and exited the room.   Infant Feeding Plan:  Breastfeed 8+ times in 24 hours according to feeding cues.  Hand express and feed the expressed milk to the infant via a spoon.  Call RN/LC for assistance with breastfeeding.    Maternal Data Has patient been taught Hand Expression?: Yes Does the patient have breastfeeding experience prior to this delivery?: Yes How long did the patient breastfeed?: 10 months  Feeding Mother's Current Feeding Choice: Breast Milk and Formula   Interventions Interventions: LC Services brochure  Discharge Pump: DEBP;Personal  Consult Status Consult Status: Follow-up Date: 10/06/22 Follow-up type: In-patient    Delene Loll 10/05/2022, 8:35 AM

## 2022-10-06 ENCOUNTER — Other Ambulatory Visit (HOSPITAL_COMMUNITY): Payer: Self-pay

## 2022-10-06 MED ORDER — IBUPROFEN 600 MG PO TABS
600.0000 mg | ORAL_TABLET | Freq: Four times a day (QID) | ORAL | 0 refills | Status: AC
  Filled 2022-10-06: qty 30, 8d supply, fill #0

## 2022-10-06 NOTE — Progress Notes (Signed)
PPD #2 Some urinary urgency,  no other issues Afeb, VSS D/c home

## 2022-10-06 NOTE — Lactation Note (Signed)
This note was copied from a baby's chart. Lactation Consultation Note  Patient Name: Kimberly Gomez [id number] Date: 10/06/2022 Age:37 hours Reason for consult: Follow-up assessment;Infant weight loss;Term;Maternal endocrine disorder (6 % weight loss,) As LC entered the room , baby latched and feeding well, per mom comfortable.  Baby released and LC offered to check the baby for a tongue tie, mom mentioned her second baby had one and was caught later and may have effected the milk supply.  LC with gloved fingers checked , noted the upper lip to stretch well , skin notch just above the gum line, not tongue restriction under the tongue,  LC attempted to have baby suck on the gloved finger and the baby wasn't having it. Baby acted hungry and mom relatched the baby using the cross cradle and baby latched with depth. LC showed mom and dad how to ease chin down to increase depth at the breast. Latch score 9  LC reviewed BF D/C teaching and the [facility] resources.     Maternal Data    Feeding Mother's Current Feeding Choice: Breast Milk  LATCH Score Latch: Grasps breast easily, tongue down, lips flanged, rhythmical sucking.  Audible Swallowing: Spontaneous and intermittent  Type of Nipple: Everted at rest and after stimulation  Comfort (Breast/Nipple): Soft / non-tender  Hold (Positioning): Assistance needed to correctly position infant at breast and maintain latch.  LATCH Score: 9   Lactation Tools Discussed/Used    Interventions Interventions: Breast feeding basics reviewed;Assisted with latch;Adjust position;Support pillows;Position options;Education;LC Services brochure  Discharge Discharge Education: Engorgement and breast care;Warning signs for feeding baby Pump: Personal;DEBP  Consult Status Consult Status: Complete Date: 10/06/22    Kathrin Greathouse 10/06/2022, 12:24 PM

## 2022-10-06 NOTE — Discharge Summary (Signed)
Postpartum Discharge Summary      Patient Name: Kimberly Gomez DOB: 1985/10/07 MRN: [id number]  Date of admission: 10/04/2022 Delivery date:10/04/2022  Delivering provider: Waynard Reeds  Date of discharge: 10/06/2022  Admitting diagnosis: Term pregnancy [Z34.90] Spontaneous vaginal delivery [O80] Intrauterine pregnancy: [redacted]w[redacted]d     Secondary diagnosis:  Principal Problem:   Term pregnancy Active Problems:   Spontaneous vaginal delivery    Discharge diagnosis: Term Pregnancy Delivered                                               Hospital course: Induction of Labor With Vaginal Delivery   37 y.o. yo [phone] at [redacted]w[redacted]d was admitted to the hospital 10/04/2022 for induction of labor.  Indication for induction:  IVF pregnancy .  Patient had an labor course complicated by nothing Membrane Rupture Time/Date: 3:44 PM ,10/04/2022   Delivery Method:Vaginal, Spontaneous  Episiotomy: None  Lacerations:  None  Details of delivery can be found in separate delivery note.  Patient had a postpartum course complicated by nothing. Patient is discharged home 10/06/22.  Newborn Data: Birth date:10/04/2022  Birth time:11:11 PM  Gender:Female  Living status:Living  Apgars:9 ,9  Weight:3790 g   Physical exam  Vitals:   10/05/22 0954 10/05/22 1325 10/05/22 2132 10/06/22 0552  BP: 111/73 113/78 121/87 106/67  Pulse: 71 65 87 89  Resp: Temp: 98.9 F (37.2 C) 98.2 F (36.8 C) 97.7 F (36.5 C) 97.6 F (36.4 C)  TempSrc: Oral Oral Oral Oral  SpO2: 99% 100% 99% 98%  Weight:      Height:       General: alert Lochia: appropriate Uterine Fundus: firm  Labs: Lab Results  Component Value Date   WBC 15.5 (H) 10/05/2022   HGB 11.3 (L) 10/05/2022   HCT 31.8 (L) 10/05/2022   MCV 92.7 10/05/2022   PLT 195 10/05/2022      Latest Ref Rng & Units 10/04/2022   11:18 AM  CMP  Glucose 70 - 99 mg/dL 68   BUN 6 - 20 mg/dL 10   Creatinine 1.47 - 1.00 mg/dL 8.29   Sodium 562 - 130  mmol/L 137   Potassium 3.5 - 5.1 mmol/L 3.8   Chloride 98 - 111 mmol/L 104   CO2 22 - 32 mmol/L 20   Calcium 8.9 - 10.3 mg/dL 9.0    Edinburgh Score:    10/05/2022    1:44 AM  Edinburgh Postnatal Depression Scale Screening Tool  I have been able to laugh and see the funny side of things. 0  I have looked forward with enjoyment to things. 0  I have blamed myself unnecessarily when things went wrong. 0  I have been anxious or worried for no good reason. 0  I have felt scared or panicky for no good reason. 0  Things have been getting on top of me. 0  I have been so unhappy that I have had difficulty sleeping. 0  I have felt sad or miserable. 0  I have been so unhappy that I have been crying. 0  The thought of harming myself has occurred to me. 0  Edinburgh Postnatal Depression Scale Total 0      After visit meds:  Allergies as of 10/06/2022       Reactions   Amoxicillin    Has  patient had a PCN reaction causing immediate rash, facial/tongue/throat swelling, SOB or lightheadedness with hypotension: Yes Has patient had a PCN reaction causing severe rash involving mucus membranes or skin necrosis: Yes Has patient had a PCN reaction that required hospitalization: no Has patient had a PCN reaction occurring within the last 10 years: No If all of the above answers are "NO", then may proceed with Cephalosporin use.   Penicillins         Medication List     STOP taking these medications    aspirin EC 81 MG tablet   B-D 3CC LUER-LOK SYR 23GX1" 23G X 1" 3 ML Misc Generic drug: SYRINGE-NEEDLE (DISP) 3 ML       TAKE these medications    ibuprofen 600 MG tablet Commonly known as: ADVIL Take 1 tablet (600 mg total) by mouth every 6 (six) hours.   prenatal multivitamin Tabs tablet Take 1 tablet by mouth daily at 12 noon.         Discharge home in stable condition Infant Feeding: Breast Infant Disposition:home with mother Discharge instruction: per After Visit  Summary and Postpartum booklet. Activity: Advance as tolerated. Pelvic rest for 6 weeks.  Diet: routine diet Postpartum Appointment:4 weeks Follow up Visit:  Follow-up Information     Ob/Gyn, [facility]. Schedule an appointment as soon as possible for a visit in 4 week(s).   Contact information: [address] [place] [address] [phone]                     10/06/2022 Zenaida Niece, MD

## 2022-10-06 NOTE — Discharge Instructions (Signed)
As per discharge pamphlet °

## 2022-10-08 ENCOUNTER — Other Ambulatory Visit (HOSPITAL_COMMUNITY): Payer: Self-pay

## 2022-10-08 MED ORDER — ESTRADIOL 0.1 MG/GM VA CREA
TOPICAL_CREAM | VAGINAL | 4 refills | Status: DC
  Filled 2022-10-08: qty 42.5, 30d supply, fill #0

## 2022-10-16 ENCOUNTER — Telehealth (HOSPITAL_COMMUNITY): Payer: Self-pay | Admitting: *Deleted

## 2022-10-16 NOTE — Telephone Encounter (Signed)
Mom reports feeling good. No concerns about herself at this time. EPDS not completed as mom reports feeling well emotionally [facility] score=0) Mom reports baby is doing well. Feeding, peeing, and pooping without difficulty. Safe sleep reviewed. Mom reports no concerns about baby at present.  Duffy Rhody, RN 10-16-2022 at 11:36am

## 2022-10-17 ENCOUNTER — Inpatient Hospital Stay (HOSPITAL_COMMUNITY): Payer: 59

## 2022-10-22 ENCOUNTER — Encounter: Payer: Self-pay | Admitting: Nurse Practitioner

## 2022-12-18 ENCOUNTER — Ambulatory Visit: Payer: Self-pay | Admitting: Physical Therapy

## 2023-01-02 ENCOUNTER — Other Ambulatory Visit: Payer: Self-pay

## 2023-01-02 ENCOUNTER — Ambulatory Visit: Payer: 59 | Attending: Obstetrics and Gynecology | Admitting: Physical Therapy

## 2023-01-02 DIAGNOSIS — M62838 Other muscle spasm: Secondary | ICD-10-CM | POA: Insufficient documentation

## 2023-01-02 DIAGNOSIS — R279 Unspecified lack of coordination: Secondary | ICD-10-CM | POA: Diagnosis not present

## 2023-01-02 DIAGNOSIS — M6281 Muscle weakness (generalized): Secondary | ICD-10-CM | POA: Insufficient documentation

## 2023-01-02 NOTE — Therapy (Signed)
OUTPATIENT PHYSICAL THERAPY FEMALE PELVIC EVALUATION   Patient Name: Kimberly Gomez MRN: [id number] DOB:Sep 22, 1985, 37 y.o., female Today's Date: 01/02/2023  END OF SESSION:  PT End of Session - 01/02/23 0923     Visit Number 1    Date for PT Re-Evaluation 03/27/23    Authorization Type [employer] employee    PT Start Time [phone]    PT Stop Time 1010    PT Time Calculation (min) 47 min    Activity Tolerance Patient tolerated treatment well    Behavior During Therapy [facility] for tasks assessed/performed             Past Medical History:  Diagnosis Date   Gestational (pregnancy-induced) hypertension without significant proteinuria, third trimester 10/06/2017   Gestational hypertension w/o significant proteinuria in 3rd trimester 10/05/2017   History of pre-eclampsia in prior pregnancy, currently pregnant    Hypertensive disorder 10/15/2017   Hypothyroidism    PROM (premature rupture of membranes) 12/06/2020   Past Surgical History:  Procedure Laterality Date   IVF retrievals     ROTATOR CUFF REPAIR Left    WISDOM TOOTH EXTRACTION     Patient Active Problem List   Diagnosis Date Noted   Spontaneous vaginal delivery 10/05/2022   Term pregnancy 10/04/2022   Non-seasonal allergic rhinitis 09/20/2021   Body mass index (BMI) 21.0-21.9, adult 09/20/2021    PCP: Tollie Eth, NP   REFERRING PROVIDER: Waynard Reeds, MD   REFERRING DIAG: N39.3 (ICD-10-CM) - Stress incontinence (female) (female)   THERAPY DIAG:  Other muscle spasm  Muscle weakness (generalized)  Unspecified lack of coordination  Rationale for Evaluation and Treatment: Rehabilitation  ONSET DATE: delivery date 10/04/22  SUBJECTIVE:                                                                                                                                                                                           SUBJECTIVE STATEMENT: I haven't had leakage recently, had urethra dilated initially.   My core feels weak Fluid intake: Yes: at least 2-3  pumping  PAIN:  Are you having pain? No   PRECAUTIONS: None  RED FLAGS: None   WEIGHT BEARING RESTRICTIONS: No  FALLS:  Has patient fallen in last 6 months? No  LIVING ENVIRONMENT: Lives with: lives with their spouse Lives in: House/apartment   OCCUPATION: PT in acute care  PLOF: Independent  PATIENT GOALS: get HEP to strengthen/start feeling stronger  PERTINENT HISTORY:  3 vaginal deliveries , 2nd deg tear with second Sexual abuse: No  BOWEL MOVEMENT: Pain with bowel movement: Nohad a hemorrhoid and still feel it a little internally (bleeding occasionally)  Type of bowel movement:Strain No Fully empty rectum: Yes:   Leakage: No   URINATION: Not currently and hasn't been having urge or any issues  INTERCOURSE: Pain with intercourse: No pain   PREGNANCY: Vaginal deliveries 3 Tearing No 2nd deg tear with second   PROLAPSE: None   OBJECTIVE:   DIAGNOSTIC FINDINGS:    PATIENT SURVEYS:    COGNITION: Overall cognitive status: Within functional limits for tasks assessed       MUSCLE LENGTH: Hamstrings: Right 70 deg; Left 70 deg   LUMBAR SPECIAL TESTS:    FUNCTIONAL TESTS:  Single leg stand normal  GAIT:  Comments: WFL   POSTURE: increased thoracic kyphosis and anterior pelvic tilt  PELVIC ALIGNMENT: normal  LUMBARAROM/PROM:  A/PROM A/PROM  eval  Flexion 80%  Extension   Right lateral flexion   Left lateral flexion   Right rotation   Left rotation    (Blank rows = not tested)  LOWER EXTREMITY ROM:  Passive ROM Right eval Left eval  Hip flexion 80% 80%  Hip extension    Hip abduction    Hip adduction    Hip internal rotation full 80%  Hip external rotation 75% 75%  Knee flexion    Knee extension    Ankle dorsiflexion    Ankle plantarflexion    Ankle inversion    Ankle eversion     (Blank rows = not tested)  LOWER EXTREMITY MMT:  MMT Right eval Left eval   Hip flexion    Hip extension    Hip abduction 4/5 4/5  Hip adduction    Hip internal rotation    Hip external rotation 4/5   Knee flexion    Knee extension    Ankle dorsiflexion    Ankle plantarflexion    Ankle inversion    Ankle eversion     PALPATION:   General  - normal                External Perineal Exam  - normal                             Internal Pelvic Floor high tone Rt>Lt  Patient confirms identification and approves PT to assess internal pelvic floor and treatment Yes  PELVIC MMT:   MMT eval  Vaginal 2/5 x 3 reps, 3 sec hold max, difficulty relaxing after contracted  Internal Anal Sphincter   External Anal Sphincter   Puborectalis   Diastasis Recti   (Blank rows = not tested)        TONE: high  PROLAPSE: no  TODAY'S TREATMENT:                                                                                                                              DATE: 01/02/23  EVAL and initial HEP and moisturizer handout   PATIENT EDUCATION:  Education details: initial HEP and moisturizer handout  Person educated: Patient Education method: Explanation, Demonstration, Tactile cues, Verbal cues, and Handouts Education comprehension: verbalized understanding and returned demonstration  HOME EXERCISE PROGRAM: Access Code: [email or web address] URL: [email or web address] Date: 01/02/2023 Prepared by: Dwana Curd  Exercises - Supine Hamstring Stretch with Strap  - 1 x daily - 7 x weekly - 1 sets - 3 reps - 30 sec hold - Hip External Rotation Stretch  - 1 x daily - 7 x weekly - 3 sets - 10 reps - Quadruped Circle Weight Shifts  - 1 x daily - 7 x weekly - 3 sets - 10 reps - Weight shift quadruped  - 1 x daily - 7 x weekly - 3 sets - 10 reps - Hooklying Transversus Abdominis Palpation  - 1 x daily - 7 x weekly - 3 sets - 10 reps  ASSESSMENT:  CLINICAL IMPRESSION: Patient is a 37 y.o. post partum female who was seen today for physical therapy  evaluation and treatment for stress incontinence.  Pt has tight gluteals and hamstrings with limited ROM, tension throughout lumbar and thoracic paraspinals.  Pt assessed for DRA and has one finger width and one knuckle deep superior to umbilicus.  Pt demonstrates some lateral hip weakness and pelvic floor with high tone.  Pt has weakness of the pelvic floor due to high tone.  Pt not yet back to normal exercise routine but is typically very active.  Pt was given initial exercises and ways to begin stretching and relaxing the pelvic floor.  Today is a one time visit due to pt moving to [place] next week.  OBJECTIVE IMPAIRMENTS: decreased coordination, decreased ROM, decreased strength, increased muscle spasms, impaired flexibility, impaired tone, and postural dysfunction.   ACTIVITY LIMITATIONS: continence and exercise  PARTICIPATION LIMITATIONS: community activity involving higher level of activity  PERSONAL FACTORS: 1-2 comorbidities: 3 vaginal deliveries and had 2nd degree tear  are also affecting patient's functional outcome.   REHAB POTENTIAL: Excellent  CLINICAL DECISION MAKING: Stable/uncomplicated  EVALUATION COMPLEXITY: Low   GOALS: Goals reviewed with patient? Yes   LONG TERM GOALS: Target date: 01/02/23   Ind with HEP as given today Baseline:  Goal status: MET  PLAN:  PT FREQUENCY: one time visit  PT DURATION: other: today  PLANNED INTERVENTIONS: Therapeutic exercises, Therapeutic activity, Neuromuscular re-education, Balance training, Gait training, Patient/Family education, Self Care, and Joint mobilization  PLAN FOR NEXT SESSION: NA   Jakki L Almedia Cordell, PT 01/02/2023, 10:45 AM

## 2023-01-02 NOTE — Patient Instructions (Signed)
Moisturizers They are used in the vagina to hydrate the mucous membrane that make up the vaginal canal. Designed to keep a more normal acid balance (ph) Once placed in the vagina, it will last between two to three days.  Use 2-3 times per week at bedtime  Ingredients to avoid is glycerin and fragrance, can increase chance of infection Should not be used just before sex due to causing irritation Most are gels administered either in a tampon-shaped applicator or as a vaginal suppository. They are non-hormonal.   Types of Moisturizers(internal use)  Vitamin E vaginal suppositories- Whole foods, Amazon Moist Again Coconut oil- can break down condoms, any grocery store (prefer organic) Julva- (Do no use if taking  Tamoxifen) amazon Yes moisturizer- amazon NeuEve Silk , NeuEve Silver for menopausal or over 65 (if have severe vaginal atrophy or cancer treatments use NeuEve Silk for  1 month than move to NeuEve Silver)- Amazon, Neuve.com Olive and Bee intimate cream- [email or web address].com.au Mae vaginal moisturizer- Amazon Aloe Good Clean Love Hyaluronic acid Hyalofemme Reveree hyaluronic acid inserts   Creams to use externally on the Vulva area Desert Harvest Releveum (good for for cancer patients that had radiation to the area)- amazon or www.[email or web address] Vulva Balm/ V-magic cream by medicine mama- amazon Julva-amazon Vital "V Wild Yam salve ( help moisturize and help with thinning vulvar area, does have Beeswax MoodMaid Botanical Pro-Meno Wild Yam Cream- Amazon Desert Harvest Gele Cleo by Damiva labial moisturizer (Amazon),  Coconut or olive oil aloe Good Clean Love Enchanted Rose by intimate rose  Things to avoid in the vaginal area Do not use things to irritate the vulvar area No lotions just specialized creams for the vulva area- Neogyn, V-magic,  No soaps; can use Aveeno or Calendula cleanser, unscented Dove if needed. Must be gentle No deodorants No douches Good to  sleep without underwear to let the vaginal area to air out No scrubbing: spread the lips to let warm water rinse over labias and pat dry
